# Patient Record
Sex: Female | Born: 1990 | Race: Black or African American | Hispanic: No | Marital: Single | Smoking: Never smoker
Health system: Southern US, Community
[De-identification: ages and names within clinical notes are randomized; demographics above are authoritative.]

## PROBLEM LIST (undated history)

## (undated) ENCOUNTER — Inpatient Hospital Stay (HOSPITAL_COMMUNITY): Payer: Self-pay

## (undated) DIAGNOSIS — F410 Panic disorder [episodic paroxysmal anxiety] without agoraphobia: Secondary | ICD-10-CM

## (undated) DIAGNOSIS — J45909 Unspecified asthma, uncomplicated: Secondary | ICD-10-CM

## (undated) DIAGNOSIS — Z9109 Other allergy status, other than to drugs and biological substances: Secondary | ICD-10-CM

## (undated) DIAGNOSIS — K8020 Calculus of gallbladder without cholecystitis without obstruction: Secondary | ICD-10-CM

## (undated) HISTORY — PX: NO PAST SURGERIES: SHX2092

---
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
## 2010-01-25 ENCOUNTER — Emergency Department (HOSPITAL_BASED_OUTPATIENT_CLINIC_OR_DEPARTMENT_OTHER)
Admission: EM | Admit: 2010-01-25 | Discharge: 2010-01-25 | Payer: Self-pay | Source: Home / Self Care | Admitting: Emergency Medicine

## 2010-04-20 LAB — URINE CULTURE: Colony Count: >=100000

## 2010-04-20 LAB — URINALYSIS, ROUTINE W REFLEX MICROSCOPIC
Bilirubin Urine: NEGATIVE
Nitrite: POSITIVE — AB
Specific Gravity, Urine: 1.026 (ref 1.005–1.030)
Urobilinogen, UA: 1 mg/dL (ref 0.0–1.0)
pH: 6.5 (ref 5.0–8.0)

## 2010-04-20 LAB — URINE MICROSCOPIC-ADD ON

## 2010-04-20 LAB — PREGNANCY, URINE: Preg Test, Ur: NEGATIVE

## 2010-05-12 LAB — CBC
HCT: 36.5 % (ref 36.0–46.0)
Hemoglobin: 10.4 g/dL — ABNORMAL LOW (ref 12.0–15.0)
Hemoglobin: 12.5 g/dL (ref 12.0–15.0)
Platelets: 212 10*3/uL (ref 150–400)
Platelets: 246 10*3/uL (ref 150–400)
RDW: 13.3 % (ref 11.5–15.5)
RDW: 13.4 % (ref 11.5–15.5)
WBC: 13.4 10*3/uL — ABNORMAL HIGH (ref 4.0–10.5)

## 2010-05-12 LAB — RPR: RPR Ser Ql: NONREACTIVE

## 2010-05-15 LAB — URINALYSIS, ROUTINE W REFLEX MICROSCOPIC
Ketones, ur: NEGATIVE mg/dL
Protein, ur: NEGATIVE mg/dL

## 2010-05-15 LAB — STREP B DNA PROBE: Strep Group B Ag: NEGATIVE

## 2010-05-15 LAB — WET PREP, GENITAL
Trich, Wet Prep: NONE SEEN
Yeast Wet Prep HPF POC: NONE SEEN

## 2010-05-15 LAB — RAPID URINE DRUG SCREEN, HOSP PERFORMED
Amphetamines: NOT DETECTED
Cocaine: NOT DETECTED
Opiates: NOT DETECTED

## 2010-05-16 LAB — URINALYSIS, ROUTINE W REFLEX MICROSCOPIC
Glucose, UA: NEGATIVE mg/dL
Hgb urine dipstick: NEGATIVE
Ketones, ur: NEGATIVE mg/dL
Nitrite: NEGATIVE
Protein, ur: NEGATIVE mg/dL
Specific Gravity, Urine: 1.014 (ref 1.005–1.030)
pH: 8 (ref 5.0–8.0)

## 2010-05-16 LAB — POCT I-STAT, CHEM 8
BUN: <3 mg/dL — ABNORMAL LOW (ref 6–23)
Glucose, Bld: 77 mg/dL (ref 70–99)
Potassium: 3.9 mEq/L (ref 3.5–5.1)
Sodium: 137 mEq/L (ref 135–145)
TCO2: 25 mmol/L (ref 0–100)

## 2010-05-16 LAB — GLUCOSE, CAPILLARY: Glucose-Capillary: 95 mg/dL (ref 70–99)

## 2010-05-16 LAB — ABO/RH: ABO/RH(D): O POS

## 2010-05-16 LAB — URINE CULTURE

## 2010-05-17 LAB — URINALYSIS, ROUTINE W REFLEX MICROSCOPIC
Bilirubin Urine: NEGATIVE
Glucose, UA: NEGATIVE mg/dL
Ketones, ur: NEGATIVE mg/dL
Nitrite: NEGATIVE
Urobilinogen, UA: 1 mg/dL (ref 0.0–1.0)

## 2010-05-17 LAB — BASIC METABOLIC PANEL
BUN: 9 mg/dL (ref 6–23)
Calcium: 9 mg/dL (ref 8.4–10.5)
Chloride: 105 mEq/L (ref 96–112)
Creatinine, Ser: 0.6 mg/dL (ref 0.4–1.2)
GFR calc Af Amer: >60 mL/min (ref >60)
GFR calc non Af Amer: >60 mL/min (ref >60)
Potassium: 3.8 mEq/L (ref 3.5–5.1)

## 2010-05-17 LAB — CBC
RBC: 4.07 MIL/uL (ref 3.87–5.11)
RDW: 12.6 % (ref 11.5–15.5)

## 2010-05-17 LAB — WET PREP, GENITAL
Clue Cells Wet Prep HPF POC: NONE SEEN
Trich, Wet Prep: NONE SEEN
Yeast Wet Prep HPF POC: NONE SEEN

## 2010-05-17 LAB — ABO/RH: ABO/RH(D): O POS

## 2010-05-17 LAB — DIFFERENTIAL
Basophils Absolute: 0 10*3/uL (ref 0.0–0.1)
Basophils Relative: 0 % (ref 0–1)
Eosinophils Relative: 2 % (ref 0–5)
Neutro Abs: 6.4 10*3/uL (ref 1.7–7.7)
Neutrophils Relative %: 69 % (ref 43–77)

## 2011-02-09 NOTE — L&D Delivery Note (Signed)
Delivery Note Pt rapidly progressed to complete dilation while waiting for epidural.  She then pushed about 5 min and at 2:25 PM a healthy female was delivered via  (Presentation:LOA).  APGAR:8,9 weight pending .   Placenta status: , .  Cord:  with the following complications: none.   Anesthesia:none Episiotomy: n/a Lacerations: none Suture Repair: n/a Est. Blood Loss (mL): 300cc  Mom to postpartum.  Baby to nursery-stable.  Oliver Pila 08/09/2011, 2:38 PM

## 2011-04-15 ENCOUNTER — Encounter (HOSPITAL_BASED_OUTPATIENT_CLINIC_OR_DEPARTMENT_OTHER): Payer: Self-pay | Admitting: *Deleted

## 2011-04-15 ENCOUNTER — Emergency Department (HOSPITAL_BASED_OUTPATIENT_CLINIC_OR_DEPARTMENT_OTHER)
Admission: EM | Admit: 2011-04-15 | Discharge: 2011-04-15 | Disposition: A | Discharge status: Home / Self Care | Payer: Medicaid Other | Attending: Emergency Medicine | Admitting: Emergency Medicine

## 2011-04-15 DIAGNOSIS — O210 Mild hyperemesis gravidarum: Secondary | ICD-10-CM | POA: Insufficient documentation

## 2011-04-15 DIAGNOSIS — O9989 Other specified diseases and conditions complicating pregnancy, childbirth and the puerperium: Secondary | ICD-10-CM | POA: Insufficient documentation

## 2011-04-15 DIAGNOSIS — R51 Headache: Secondary | ICD-10-CM

## 2011-04-15 MED ORDER — DIPHENHYDRAMINE HCL 50 MG/ML IJ SOLN
25.0000 mg | Freq: Once | INTRAMUSCULAR | Status: AC
  Administered 2011-04-15: 25 mg via INTRAVENOUS
  Filled 2011-04-15: qty 1

## 2011-04-15 MED ORDER — METOCLOPRAMIDE HCL 5 MG/ML IJ SOLN
10.0000 mg | Freq: Once | INTRAMUSCULAR | Status: AC
  Administered 2011-04-15: 10 mg via INTRAMUSCULAR
  Filled 2011-04-15: qty 2
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
## 2011-04-15 NOTE — ED Notes (Signed)
C/o headaches with nausea and vomiting since earlier today. Pt is [redacted] weeks pregnant.

## 2011-04-15 NOTE — Discharge Instructions (Signed)

## 2011-04-18 ENCOUNTER — Encounter (HOSPITAL_BASED_OUTPATIENT_CLINIC_OR_DEPARTMENT_OTHER): Payer: Self-pay

## 2011-04-18 ENCOUNTER — Emergency Department (HOSPITAL_BASED_OUTPATIENT_CLINIC_OR_DEPARTMENT_OTHER)
Admission: EM | Admit: 2011-04-18 | Discharge: 2011-04-19 | Disposition: A | Discharge status: Home / Self Care | Payer: Medicaid Other | Attending: Emergency Medicine | Admitting: Emergency Medicine

## 2011-04-18 DIAGNOSIS — O219 Vomiting of pregnancy, unspecified: Secondary | ICD-10-CM

## 2011-04-18 DIAGNOSIS — R51 Headache: Secondary | ICD-10-CM | POA: Insufficient documentation

## 2011-04-18 DIAGNOSIS — O9989 Other specified diseases and conditions complicating pregnancy, childbirth and the puerperium: Secondary | ICD-10-CM | POA: Insufficient documentation

## 2011-04-18 LAB — URINALYSIS, ROUTINE W REFLEX MICROSCOPIC
Bilirubin Urine: NEGATIVE
Glucose, UA: NEGATIVE mg/dL
Hgb urine dipstick: NEGATIVE
Ketones, ur: >80 mg/dL — AB
Protein, ur: NEGATIVE mg/dL

## 2011-04-18 LAB — URINE MICROSCOPIC-ADD ON

## 2011-04-18 MED ORDER — DIPHENHYDRAMINE HCL 50 MG/ML IJ SOLN
12.5000 mg | Freq: Once | INTRAMUSCULAR | Status: AC
  Administered 2011-04-18: 12.5 mg via INTRAVENOUS
  Filled 2011-04-18: qty 1

## 2011-04-18 MED ORDER — SODIUM CHLORIDE 0.9 % IV SOLN
Freq: Once | INTRAVENOUS | Status: AC
  Administered 2011-04-18: 1000 mL via INTRAVENOUS

## 2011-04-18 MED ORDER — METOCLOPRAMIDE HCL 5 MG/ML IJ SOLN
10.0000 mg | Freq: Once | INTRAMUSCULAR | Status: AC
  Administered 2011-04-18: 10 mg via INTRAVENOUS
  Filled 2011-04-18: qty 2

## 2011-04-18 NOTE — ED Provider Notes (Signed)
Medical screening examination/treatment/procedure(s) were performed by non-physician practitioner and as supervising physician I was immediately available for consultation/collaboration.   Mykah Bellomo E Tyee Vandevoorde, MD 04/18/11 0734 

## 2011-04-18 NOTE — ED Provider Notes (Signed)
History     CSN: [id number]  Arrival date & time 04/18/11  2157   First MD Initiated Contact with Patient 04/18/11 2303      Chief Complaint  Patient presents with  . Headache  . Emesis During Pregnancy    (Consider location/radiation/quality/duration/timing/severity/associated sxs/prior treatment) Patient is a 21 y.o. female presenting with headaches and vomiting. The history is provided by the patient. No language interpreter was used.  Headache  This is a recurrent problem. The current episode started 12 to 24 hours ago. The problem occurs constantly. The problem has been gradually worsening. The headache is associated with an unknown factor. The pain is located in the frontal region. The quality of the pain is described as sharp. The pain is at a severity of 6/10. The pain does not radiate. Associated symptoms include nausea and vomiting. She has tried nothing for the symptoms. The treatment provided moderate relief.  Emesis  This is a new problem. The current episode started 2 days ago. The problem has not changed since onset.The emesis has an appearance of stomach contents. There has been no fever. Associated symptoms include headaches.  Pt is pregnant.  Pt reports mul tuple episodes of vomiting.  Pt reports a headache from vomiting.  Pt is pregnant.  She is schedule to recheck with her OB on Tuesday.  Pt was here 5 days ago for the same.  Pt felt better after reglan injection  No past medical history on file.  No past surgical history on file.  No family history on file.  History  Substance Use Topics  . Smoking status: Never Smoker   . Smokeless tobacco: Not on file  . Alcohol Use: No    OB History    Grav Para Term Preterm Abortions TAB SAB Ect Mult Living   2 1 1       1       Review of Systems  Gastrointestinal: Positive for nausea and vomiting.  Neurological: Positive for headaches.  All other systems reviewed and are negative.    Allergies  Review of  patient's allergies indicates no known allergies.  Home Medications   Current Outpatient Rx  Name Route Sig Dispense Refill  . ACETAMINOPHEN 325 MG PO TABS Oral Take 325 mg by mouth once as needed. For pain    . PRE-NATAL PO Oral Take 1 tablet by mouth daily.      BP 120/58  Pulse 117  Temp(Src) 98.6 F (37 C) (Oral)  Resp 20  Ht 5\' 4"  (1.626 m)  Wt 141 lb (63.957 kg)  BMI 24.20 kg/m2  LMP 11/19/2010  Physical Exam  Vitals reviewed. Constitutional: She is oriented to person, place, and time. She appears well-developed and well-nourished.  HENT:  Head: Normocephalic and atraumatic.  Right Ear: External ear normal.  Left Ear: External ear normal.  Nose: Nose normal.  Mouth/Throat: Oropharynx is clear and moist.  Eyes: Pupils are equal, round, and reactive to light.  Neck: Normal range of motion.  Cardiovascular: Normal rate and normal heart sounds.   Pulmonary/Chest: Effort normal.  Abdominal: Soft.  Musculoskeletal: Normal range of motion.  Neurological: She is alert and oriented to person, place, and time.  Skin: Skin is warm.  Psychiatric: She has a normal mood and affect.    ED Course  Procedures (including critical care time)  Labs Reviewed  URINALYSIS, ROUTINE W REFLEX MICROSCOPIC - Abnormal; Notable for the following:    Ketones, ur >80 (*)    Leukocytes, UA SMALL (*)  All other components within normal limits  URINE MICROSCOPIC-ADD ON - Abnormal; Notable for the following:    Squamous Epithelial / LPF FEW (*)    Bacteria, UA FEW (*)    All other components within normal limits   No results found.   No diagnosis found.    MDM  Pt given IV ns x 2 liters,  Benadryl and reglan.   [facility] advised [facility] any futher vomiting.  Pt is advised to see Dr. Ellyn Hack as scheduled        Lonia Skinner [place], [place] 04/19/11 0000

## 2011-04-18 NOTE — ED Notes (Signed)
Pt is pregnant, c/o headache with severe nausea/vomiting.

## 2011-04-19 MED ORDER — PROMETHAZINE HCL 25 MG RE SUPP: 25.0000 mg | Freq: Four times a day (QID) | RECTAL | Status: DC | PRN

## 2011-04-19 NOTE — Discharge Instructions (Signed)
Nausea and Vomiting   Nausea means you feel sick to your stomach. Throwing up (vomiting) is a reflex where stomach contents come out of your mouth.   HOME CARE   Take medicine as told by your doctor.   Do not force yourself to eat. However, you do need to drink fluids.   If you feel like eating, eat a normal diet as told by your doctor.   Eat rice, wheat, potatoes, bread, lean meats, yogurt, fruits, and vegetables.   Avoid high-fat foods.   Drink enough fluids to keep your pee (urine) clear or pale yellow.   Ask your doctor how to replace body fluid losses (rehydrate). Signs of body fluid loss (dehydration) include:   Feeling very thirsty.   Dry lips and mouth.   Feeling dizzy.   Dark pee.   Peeing less than normal.   Feeling confused.   Fast breathing or heart rate.   GET HELP RIGHT AWAY IF:   You have blood in your throw up.   You have black or bloody poop (stool).   You have a bad headache or stiff neck.   You feel confused.   You have bad belly (abdominal) pain.   You have chest pain or trouble breathing.   You do not pee at least once every 8 hours.   You have cold, clammy skin.   You keep throwing up after 24 to 48 hours.   You have a fever.   MAKE SURE YOU:   Understand these instructions.   Will watch your condition.   Will get help right away if you are not doing well or get worse.   Document Released: 07/14/2007 Document Revised: 01/14/2011 Document Reviewed: 06/26/2010   [employer] Patient Information 2012 [employer].

## 2011-04-19 NOTE — ED Provider Notes (Signed)
Medical screening examination/treatment/procedure(s) were performed by non-physician practitioner and as supervising physician I was immediately available for consultation/collaboration.   Hanley Seamen, MD 04/19/11 [phone]

## 2011-04-28 LAB — OB RESULTS CONSOLE RPR: RPR: NONREACTIVE

## 2011-04-28 LAB — OB RESULTS CONSOLE HIV ANTIBODY (ROUTINE TESTING): HIV: NONREACTIVE

## 2011-07-21 LAB — OB RESULTS CONSOLE GBS: GBS: NEGATIVE

## 2011-08-09 ENCOUNTER — Encounter (HOSPITAL_COMMUNITY): Payer: Self-pay | Admitting: *Deleted

## 2011-08-09 ENCOUNTER — Inpatient Hospital Stay (HOSPITAL_COMMUNITY)
Admission: AD | Admit: 2011-08-09 | Discharge: 2011-08-11 | DRG: 775 | Disposition: A | Discharge status: Home / Self Care | Payer: Medicaid Other | Attending: Obstetrics and Gynecology | Admitting: Obstetrics and Gynecology

## 2011-08-09 HISTORY — DX: Unspecified asthma, uncomplicated: J45.909

## 2011-08-09 LAB — CBC
Hemoglobin: 11.6 g/dL — ABNORMAL LOW (ref 12.0–15.0)
MCH: 29.7 pg (ref 26.0–34.0)
RBC: 3.91 MIL/uL (ref 3.87–5.11)
WBC: 9.8 10*3/uL (ref 4.0–10.5)

## 2011-08-09 MED ORDER — LACTATED RINGERS IV SOLN: 500.0000 mL | Freq: Once | INTRAVENOUS | Status: DC

## 2011-08-09 MED ORDER — OXYTOCIN 40 UNITS IN LACTATED RINGERS INFUSION - SIMPLE MED: 62.5000 mL/h | Freq: Once | INTRAVENOUS | Status: DC

## 2011-08-09 MED ORDER — WITCH HAZEL-GLYCERIN EX PADS: 1.0000 "application " | MEDICATED_PAD | CUTANEOUS | Status: DC | PRN

## 2011-08-09 MED ORDER — LIDOCAINE HCL (PF) 1 % IJ SOLN: 30.0000 mL | INTRAMUSCULAR | Status: DC | PRN

## 2011-08-09 MED ORDER — DIPHENHYDRAMINE HCL 25 MG PO CAPS: 25.0000 mg | ORAL_CAPSULE | Freq: Four times a day (QID) | ORAL | Status: DC | PRN

## 2011-08-09 MED ORDER — PHENYLEPHRINE 40 MCG/ML (10ML) SYRINGE FOR IV PUSH (FOR BLOOD PRESSURE SUPPORT)
80.0000 ug | PREFILLED_SYRINGE | INTRAVENOUS | Status: DC | PRN
  Filled 2011-08-09: qty 5

## 2011-08-09 MED ORDER — ONDANSETRON HCL 4 MG PO TABS: 4.0000 mg | ORAL_TABLET | ORAL | Status: DC | PRN

## 2011-08-09 MED ORDER — CITRIC ACID-SODIUM CITRATE 334-500 MG/5ML PO SOLN: 30.0000 mL | ORAL | Status: DC | PRN

## 2011-08-09 MED ORDER — OXYTOCIN 40 UNITS IN LACTATED RINGERS INFUSION - SIMPLE MED
1.0000 m[IU]/min | INTRAVENOUS | Status: DC
  Administered 2011-08-09: 2 m[IU]/min via INTRAVENOUS
  Filled 2011-08-09: qty 1000

## 2011-08-09 MED ORDER — ACETAMINOPHEN 325 MG PO TABS: 650.0000 mg | ORAL_TABLET | ORAL | Status: DC | PRN

## 2011-08-09 MED ORDER — FENTANYL 2.5 MCG/ML BUPIVACAINE 1/10 % EPIDURAL INFUSION (WH - ANES)
14.0000 mL/h | INTRAMUSCULAR | Status: DC
  Filled 2011-08-09: qty 60

## 2011-08-09 MED ORDER — SENNOSIDES-DOCUSATE SODIUM 8.6-50 MG PO TABS
2.0000 | ORAL_TABLET | Freq: Every day | ORAL | Status: DC
  Administered 2011-08-09 – 2011-08-10 (×2): 2 via ORAL

## 2011-08-09 MED ORDER — OXYCODONE-ACETAMINOPHEN 5-325 MG PO TABS
1.0000 | ORAL_TABLET | ORAL | Status: DC | PRN
  Administered 2011-08-09 – 2011-08-10 (×5): 1 via ORAL
  Administered 2011-08-10: 2 via ORAL
  Filled 2011-08-09: qty 1
  Filled 2011-08-09: qty 2
  Filled 2011-08-09 (×4): qty 1

## 2011-08-09 MED ORDER — BENZOCAINE-MENTHOL 20-0.5 % EX AERO: 1.0000 "application " | INHALATION_SPRAY | CUTANEOUS | Status: DC | PRN

## 2011-08-09 MED ORDER — DIBUCAINE 1 % RE OINT: 1.0000 "application " | TOPICAL_OINTMENT | RECTAL | Status: DC | PRN

## 2011-08-09 MED ORDER — ZOLPIDEM TARTRATE 5 MG PO TABS: 5.0000 mg | ORAL_TABLET | Freq: Every evening | ORAL | Status: DC | PRN

## 2011-08-09 MED ORDER — ONDANSETRON HCL 4 MG/2ML IJ SOLN: 4.0000 mg | Freq: Four times a day (QID) | INTRAMUSCULAR | Status: DC | PRN

## 2011-08-09 MED ORDER — PHENYLEPHRINE 40 MCG/ML (10ML) SYRINGE FOR IV PUSH (FOR BLOOD PRESSURE SUPPORT): 80.0000 ug | PREFILLED_SYRINGE | INTRAVENOUS | Status: DC | PRN

## 2011-08-09 MED ORDER — SIMETHICONE 80 MG PO CHEW: 80.0000 mg | CHEWABLE_TABLET | ORAL | Status: DC | PRN

## 2011-08-09 MED ORDER — EPHEDRINE 5 MG/ML INJ
10.0000 mg | INTRAVENOUS | Status: DC | PRN
  Filled 2011-08-09: qty 4

## 2011-08-09 MED ORDER — LANOLIN HYDROUS EX OINT: TOPICAL_OINTMENT | CUTANEOUS | Status: DC | PRN

## 2011-08-09 MED ORDER — OXYCODONE-ACETAMINOPHEN 5-325 MG PO TABS: 1.0000 | ORAL_TABLET | ORAL | Status: DC | PRN

## 2011-08-09 MED ORDER — IBUPROFEN 600 MG PO TABS
600.0000 mg | ORAL_TABLET | Freq: Four times a day (QID) | ORAL | Status: DC
  Administered 2011-08-10 – 2011-08-11 (×6): 600 mg via ORAL
  Filled 2011-08-09 (×6): qty 1

## 2011-08-09 MED ORDER — ONDANSETRON HCL 4 MG/2ML IJ SOLN: 4.0000 mg | INTRAMUSCULAR | Status: DC | PRN

## 2011-08-09 MED ORDER — LACTATED RINGERS IV SOLN: 500.0000 mL | INTRAVENOUS | Status: DC | PRN

## 2011-08-09 MED ORDER — OXYTOCIN BOLUS FROM INFUSION
250.0000 mL | Freq: Once | INTRAVENOUS | Status: DC
  Filled 2011-08-09: qty 500

## 2011-08-09 MED ORDER — EPHEDRINE 5 MG/ML INJ: 10.0000 mg | INTRAVENOUS | Status: DC | PRN

## 2011-08-09 MED ORDER — TETANUS-DIPHTH-ACELL PERTUSSIS 5-2.5-18.5 LF-MCG/0.5 IM SUSP
0.5000 mL | Freq: Once | INTRAMUSCULAR | Status: AC
  Administered 2011-08-10: 0.5 mL via INTRAMUSCULAR
  Filled 2011-08-09: qty 0.5

## 2011-08-09 MED ORDER — FLEET ENEMA 7-19 GM/118ML RE ENEM: 1.0000 | ENEMA | RECTAL | Status: DC | PRN

## 2011-08-09 MED ORDER — TERBUTALINE SULFATE 1 MG/ML IJ SOLN: 0.2500 mg | Freq: Once | INTRAMUSCULAR | Status: DC | PRN

## 2011-08-09 MED ORDER — LACTATED RINGERS IV SOLN
INTRAVENOUS | Status: DC
  Administered 2011-08-09: 10:00:00 via INTRAVENOUS

## 2011-08-09 MED ORDER — BUTORPHANOL TARTRATE 2 MG/ML IJ SOLN: 1.0000 mg | INTRAMUSCULAR | Status: DC | PRN

## 2011-08-09 MED ORDER — IBUPROFEN 600 MG PO TABS
600.0000 mg | ORAL_TABLET | Freq: Four times a day (QID) | ORAL | Status: DC | PRN
  Administered 2011-08-09: 600 mg via ORAL
  Filled 2011-08-09: qty 1

## 2011-08-09 MED ORDER — DIPHENHYDRAMINE HCL 50 MG/ML IJ SOLN: 12.5000 mg | INTRAMUSCULAR | Status: DC | PRN

## 2011-08-09 MED ORDER — PRENATAL MULTIVITAMIN CH
1.0000 | ORAL_TABLET | Freq: Every day | ORAL | Status: DC
  Administered 2011-08-10 – 2011-08-11 (×2): 1 via ORAL
  Filled 2011-08-09 (×2): qty 1

## 2011-08-09 NOTE — MAU Note (Signed)
Patient states she had leaking of fluid but is not having actively leaking at this time. Is not wearing a pad.

## 2011-08-09 NOTE — H&P (Signed)
Yvonne Gentry is a 21 y.o. female G2P1001 at 19 5/7 (EDD 08/18/11 by [place] at 20 weeks) presenting for contractions and possible ROM. Fern and Nit neg but has BBOW and cervix now c/5/-1.  Ctx are still irregular but strong when occur.  Prenatal care complicated only by late care at 24 weeks.  No other significant issues.  Maternal Medical History:  Reason for admission: Reason for admission: contractions.  Contractions: Onset was 3-5 hours ago.   Frequency: irregular.   Perceived severity is moderate.    Fetal activity: Perceived fetal activity is normal.      OB History    Grav Para Term Preterm Abortions TAB SAB Ect Mult Living   2 1 1       1     2010  NSVD 7#0oz  Past Medical History  Diagnosis Date  . Asthma    Past Surgical History  Procedure Date  . No past surgeries    Family History: family history includes Diabetes in her brother and Hypertension in her father. Social History:  reports that she has never smoked. She does not have any smokeless tobacco history on file. She reports that she does not drink alcohol or use illicit drugs.   Prenatal Transfer Tool  Maternal Diabetes: No Genetic Screening: too late to care Maternal Ultrasounds/Referrals: Normal Fetal Ultrasounds or other Referrals:  None Maternal Substance Abuse:  No Significant Maternal Medications:  None Significant Maternal Lab Results:  None Other Comments:  None  ROS Cervix c/5/-1 BBOW  Blood pressure 117/73, pulse 80, temperature 97.8 F (36.6 C), temperature source Oral, resp. rate 18, height 5\' 4"  (1.626 m), weight 74.662 kg (164 lb 9.6 oz), last menstrual period 11/19/2010. Exam Physical Exam  Constitutional: She is oriented to person, place, and time. She appears well-developed and well-nourished.  Cardiovascular: Normal rate and regular rhythm.   Respiratory: Effort normal and breath sounds normal.  GI: Soft.  Genitourinary: Vagina normal and uterus normal.  Neurological: She is alert  and oriented to person, place, and time.  Psychiatric: She has a normal mood and affect. Her behavior is normal.    Prenatal labs: ABO, Rh:  O positive Antibody: Negative (03/20 0000) Rubella: Immune (03/20 0000) RPR: Nonreactive (03/20 0000)  HBsAg: Negative (03/20 0000)  HIV: Non-reactive (03/20 0000)  GBS: Negative (06/12 0000)  One hour GTT 82 Hgb AA Too late for genetic screens Assessment/Plan: Pt admitted for latent phase labor with cervical change to 5cm.  Pt is not sure if will get epidural.  FHR reassuring but not reactive, no decels and good variability.  Oliver Pila 08/09/2011, 9:18 AM

## 2011-08-09 NOTE — Progress Notes (Signed)
  Subjective: Pt increasingly uncomfortable  Objective: BP 118/69  Pulse 77  Temp 97.8 F (36.6 C) (Oral)  Resp 20  Ht 5\' 4"  (1.626 m)  Wt 74.662 kg (164 lb 9.6 oz)  BMI 28.25 kg/m2  LMP 11/19/2010      FHT:  FHR: 135 bpm, variability: moderate,  accelerations:  Present,  decelerations:  Absent UC:   regular, every 3-4 minutes SVE:   Dilation: 5.5 Effacement (%): 100 Station: -1 Exam by:: Dr Senaida Ores AROM clear  Labs: Lab Results  Component Value Date   WBC 9.8 08/09/2011   HGB 11.6* 08/09/2011   HCT 34.1* 08/09/2011   MCV 87.2 08/09/2011   PLT 192 08/09/2011    Assessment / Plan: Pt still not in active phase, but hopefully getting there.  Will follow progress.  FHR reassuring overall.  Yvonne Gentry 08/09/2011, 1:18 PM

## 2011-08-09 NOTE — MAU Note (Signed)
Pt reported that she felt leaking fluids around 0630 while resting in bed.  Underwear was wet upon getting up and she felt more clear fluids leaking into toilet.  Feeling irregular contractions today.  Denies bleeding.  Feels decreased fetal movement.

## 2011-08-10 LAB — CBC
MCV: 87 fL (ref 78.0–100.0)
Platelets: 206 10*3/uL (ref 150–400)
RBC: 3.84 MIL/uL — ABNORMAL LOW (ref 3.87–5.11)
RDW: 12.9 % (ref 11.5–15.5)
WBC: 12.1 10*3/uL — ABNORMAL HIGH (ref 4.0–10.5)

## 2011-08-10 LAB — RPR: RPR Ser Ql: NONREACTIVE

## 2011-08-10 NOTE — Progress Notes (Signed)
Post Partum Day 1 Subjective: no complaints, voiding and tolerating PO  Objective: Blood pressure 113/67, pulse 73, temperature 97.7 F (36.5 C), temperature source Oral, resp. rate 18, height 5\' 4"  (1.626 m), weight 74.662 kg (164 lb 9.6 oz), last menstrual period 11/19/2010, unknown if currently breastfeeding.  Physical Exam:  General: alert and cooperative Lochia: appropriate Uterine Fundus: firm    Basename 08/10/11 0520 08/09/11 0940  HGB 11.2* 11.6*  HCT 33.4* 34.1*    Assessment/Plan: Plan for discharge tomorrow   LOS: 1 day   Yvonne Gentry W 08/10/2011, 9:35 AM

## 2011-08-10 NOTE — Progress Notes (Signed)
UR chart review completed.  

## 2011-08-11 MED ORDER — OXYCODONE-ACETAMINOPHEN 5-325 MG PO TABS: 1.0000 | ORAL_TABLET | ORAL | Status: AC | PRN

## 2011-08-11 MED ORDER — IBUPROFEN 600 MG PO TABS: 600.0000 mg | ORAL_TABLET | Freq: Four times a day (QID) | ORAL | Status: AC

## 2011-08-11 NOTE — Discharge Summary (Signed)
Obstetric Discharge Summary Reason for Admission: onset of labor Prenatal Procedures: none Intrapartum Procedures: spontaneous vaginal delivery Postpartum Procedures: none Complications-Operative and Postpartum: none Hemoglobin  Date Value Range Status  08/10/2011 11.2* 12.0 - 15.0 g/dL Final     HCT  Date Value Range Status  08/10/2011 33.4* 36.0 - 46.0 % Final    Physical Exam:  General: alert and cooperative Lochia: appropriate Uterine Fundus: firm   Discharge Diagnoses: Term Pregnancy-delivered  Discharge Information: Date: 08/11/2011 Activity: pelvic rest Diet: routine Medications: Ibuprofen and Percocet Condition: improved Instructions: refer to practice specific booklet Discharge to: home   Newborn Data: Live born female  Birth Weight: 7 lb 7.9 oz (3400 g) APGAR: 8, 9  Home with mother.  Yvonne Gentry 08/11/2011, 8:27 AM

## 2011-08-11 NOTE — Progress Notes (Signed)
Post Partum Day 2 Subjective: no complaints, up ad lib and tolerating PO  Objective: Blood pressure 113/72, pulse 69, temperature 98.5 F (36.9 C), temperature source Oral, resp. rate 18, height 5\' 4"  (1.626 m), weight 74.662 kg (164 lb 9.6 oz), last menstrual period 11/19/2010, unknown if currently breastfeeding.  Physical Exam:  General: alert and cooperative Lochia: appropriate Uterine Fundus: firm     Basename 08/10/11 0520 08/09/11 0940  HGB 11.2* 11.6*  HCT 33.4* 34.1*    Assessment/Plan: Discharge home Motrin and percocet Plans circumcision in office and advised to call this week to set up   LOS: 2 days   Yvonne Gentry 08/11/2011, 8:24 AM

## 2011-12-19 ENCOUNTER — Encounter (HOSPITAL_BASED_OUTPATIENT_CLINIC_OR_DEPARTMENT_OTHER): Payer: Self-pay

## 2011-12-19 ENCOUNTER — Emergency Department (HOSPITAL_BASED_OUTPATIENT_CLINIC_OR_DEPARTMENT_OTHER)
Admission: EM | Admit: 2011-12-19 | Discharge: 2011-12-19 | Disposition: A | Discharge status: Home / Self Care | Payer: Self-pay | Attending: Emergency Medicine | Admitting: Emergency Medicine

## 2011-12-19 DIAGNOSIS — H109 Unspecified conjunctivitis: Secondary | ICD-10-CM | POA: Insufficient documentation

## 2011-12-19 DIAGNOSIS — J45909 Unspecified asthma, uncomplicated: Secondary | ICD-10-CM | POA: Insufficient documentation

## 2011-12-19 DIAGNOSIS — H5789 Other specified disorders of eye and adnexa: Secondary | ICD-10-CM | POA: Insufficient documentation

## 2011-12-19 HISTORY — DX: Other allergy status, other than to drugs and biological substances: Z91.09

## 2011-12-19 MED ORDER — FLUORESCEIN SODIUM 1 MG OP STRP
ORAL_STRIP | OPHTHALMIC | Status: AC
  Administered 2011-12-19: 03:00:00
  Filled 2011-12-19: qty 1

## 2011-12-19 MED ORDER — ERYTHROMYCIN 5 MG/GM OP OINT: TOPICAL_OINTMENT | OPHTHALMIC | Status: DC

## 2011-12-19 MED ORDER — TETRACAINE HCL 0.5 % OP SOLN
OPHTHALMIC | Status: AC
  Administered 2011-12-19: 03:00:00
  Filled 2011-12-19: qty 2

## 2011-12-19 NOTE — ED Notes (Signed)
Patient complains of left eye pain with itching, redness and drainage. Denies trauma to same, no distress

## 2011-12-19 NOTE — ED Provider Notes (Signed)
History     CSN: [id number]  Arrival date & time 12/19/11  0151   First MD Initiated Contact with Patient 12/19/11 0208      Chief Complaint  Patient presents with  . Eye Pain    (Consider location/radiation/quality/duration/timing/severity/associated sxs/prior treatment) Patient is a 21 y.o. female presenting with eye pain and conjunctivitis. The history is provided by the patient.  Eye Pain This is a new problem. The current episode started 2 days ago. The problem occurs constantly. The problem has not changed since onset.Nothing aggravates the symptoms. Nothing relieves the symptoms. She has tried nothing for the symptoms. The treatment provided no relief.  Conjunctivitis  The current episode started yesterday. The onset was gradual. The problem occurs continuously. The problem has been unchanged. The problem is mild. Nothing relieves the symptoms. Nothing aggravates the symptoms. Associated symptoms include eye pain and eye redness. Pertinent negatives include no fever, no decreased vision, no double vision, no photophobia, no neck stiffness, no URI and no eye discharge. The eye pain is mild. The left eye is affected.The eye pain is not associated with movement. The eyelid exhibits no abnormality.    Past Medical History  Diagnosis Date  . Asthma   . Pollen allergies     Past Surgical History  Procedure Date  . No past surgeries     Family History  Problem Relation Age of Onset  . Hypertension Father   . Diabetes Brother     History  Substance Use Topics  . Smoking status: Never Smoker   . Smokeless tobacco: Never Used  . Alcohol Use: No    OB History    Grav Para Term Preterm Abortions TAB SAB Ect Mult Living   2 2 2       2       Review of Systems  Constitutional: Negative for fever.  Eyes: Positive for pain and redness. Negative for double vision, photophobia and discharge.  All other systems reviewed and are negative.    Allergies  Review of  patient's allergies indicates no known allergies.  Home Medications  No current outpatient prescriptions on file.  Pulse 116  Temp 97.3 F (36.3 C) (Oral)  Resp 20  SpO2 99%  LMP 12/05/2011  Breastfeeding? Yes  Physical Exam  Constitutional: She is oriented to person, place, and time. She appears well-developed and well-nourished. No distress.  HENT:  Head: Normocephalic.  Mouth/Throat: Oropharynx is clear and moist.  Eyes: EOM are normal. Pupils are equal, round, and reactive to light.       Left eye slight injection  Neck: Normal range of motion.  Cardiovascular: Normal rate and regular rhythm.   Pulmonary/Chest: Effort normal and breath sounds normal. She has no wheezes. She has no rales.  Abdominal: Soft. Bowel sounds are normal. There is no tenderness. There is no rebound and no guarding.  Musculoskeletal: Normal range of motion.  Neurological: She is alert and oriented to person, place, and time.  Skin: Skin is warm and dry.  Psychiatric: She has a normal mood and affect.    ED Course  Procedures (including critical care time)  Labs Reviewed - No data to display No results found.   No diagnosis found.    MDM  Will prescribe eye ointment good hand hygiene in the home.  Follow up with your eye doctor for continued symptoms        Darica Goren K Jaquarius Seder-Rasch, MD 12/19/11 [phone]

## 2012-03-12 ENCOUNTER — Encounter (HOSPITAL_BASED_OUTPATIENT_CLINIC_OR_DEPARTMENT_OTHER): Payer: Self-pay | Admitting: Emergency Medicine

## 2012-03-12 ENCOUNTER — Emergency Department (HOSPITAL_BASED_OUTPATIENT_CLINIC_OR_DEPARTMENT_OTHER)
Admission: EM | Admit: 2012-03-12 | Discharge: 2012-03-12 | Disposition: A | Discharge status: Home / Self Care | Payer: Self-pay | Attending: Emergency Medicine | Admitting: Emergency Medicine

## 2012-03-12 DIAGNOSIS — M545 Low back pain, unspecified: Secondary | ICD-10-CM | POA: Insufficient documentation

## 2012-03-12 DIAGNOSIS — J45909 Unspecified asthma, uncomplicated: Secondary | ICD-10-CM | POA: Insufficient documentation

## 2012-03-12 DIAGNOSIS — Z79899 Other long term (current) drug therapy: Secondary | ICD-10-CM | POA: Insufficient documentation

## 2012-03-12 DIAGNOSIS — R51 Headache: Secondary | ICD-10-CM | POA: Insufficient documentation

## 2012-03-12 DIAGNOSIS — R6883 Chills (without fever): Secondary | ICD-10-CM | POA: Insufficient documentation

## 2012-03-12 DIAGNOSIS — N12 Tubulo-interstitial nephritis, not specified as acute or chronic: Secondary | ICD-10-CM | POA: Insufficient documentation

## 2012-03-12 DIAGNOSIS — R42 Dizziness and giddiness: Secondary | ICD-10-CM | POA: Insufficient documentation

## 2012-03-12 DIAGNOSIS — Z3202 Encounter for pregnancy test, result negative: Secondary | ICD-10-CM | POA: Insufficient documentation

## 2012-03-12 DIAGNOSIS — R110 Nausea: Secondary | ICD-10-CM | POA: Insufficient documentation

## 2012-03-12 LAB — CBC WITH DIFFERENTIAL/PLATELET
Basophils Absolute: 0 10*3/uL (ref 0.0–0.1)
Eosinophils Absolute: 0.1 10*3/uL (ref 0.0–0.7)
Eosinophils Relative: 1 % (ref 0–5)
MCH: 28.5 pg (ref 26.0–34.0)
MCV: 82.4 fL (ref 78.0–100.0)
Neutrophils Relative %: 73 % (ref 43–77)
Platelets: 262 10*3/uL (ref 150–400)
RDW: 12.6 % (ref 11.5–15.5)
WBC: 10.8 10*3/uL — ABNORMAL HIGH (ref 4.0–10.5)

## 2012-03-12 LAB — COMPREHENSIVE METABOLIC PANEL
ALT: 8 U/L (ref 0–35)
AST: 14 U/L (ref 0–37)
Albumin: 3.7 g/dL (ref 3.5–5.2)
Calcium: 9.5 mg/dL (ref 8.4–10.5)
GFR calc Af Amer: >90 mL/min (ref >90)
GFR calc non Af Amer: >90 mL/min (ref >90)
Potassium: 3.4 mEq/L — ABNORMAL LOW (ref 3.5–5.1)
Sodium: 139 mEq/L (ref 135–145)
Total Bilirubin: 0.6 mg/dL (ref 0.3–1.2)
Total Protein: 7.5 g/dL (ref 6.0–8.3)

## 2012-03-12 LAB — URINALYSIS, ROUTINE W REFLEX MICROSCOPIC
Bilirubin Urine: NEGATIVE
Ketones, ur: 15 mg/dL — AB
Nitrite: POSITIVE — AB
Specific Gravity, Urine: 1.015 (ref 1.005–1.030)
Urobilinogen, UA: 0.2 mg/dL (ref 0.0–1.0)

## 2012-03-12 LAB — URINE MICROSCOPIC-ADD ON

## 2012-03-12 LAB — LIPASE, BLOOD: Lipase: 21 U/L (ref 11–59)

## 2012-03-12 MED ORDER — ONDANSETRON HCL 4 MG/2ML IJ SOLN
4.0000 mg | Freq: Once | INTRAMUSCULAR | Status: AC
  Administered 2012-03-12: 4 mg via INTRAVENOUS
  Filled 2012-03-12: qty 2

## 2012-03-12 MED ORDER — DEXTROSE 5 % IV SOLN
1.0000 g | Freq: Once | INTRAVENOUS | Status: AC
  Administered 2012-03-12: 1 g via INTRAVENOUS
  Filled 2012-03-12: qty 10

## 2012-03-12 MED ORDER — HYDROMORPHONE HCL PF 1 MG/ML IJ SOLN
1.0000 mg | Freq: Once | INTRAMUSCULAR | Status: AC
  Administered 2012-03-12: 1 mg via INTRAVENOUS
  Filled 2012-03-12: qty 1

## 2012-03-12 MED ORDER — CEPHALEXIN 500 MG PO CAPS: 500.0000 mg | ORAL_CAPSULE | Freq: Four times a day (QID) | ORAL | Status: DC

## 2012-03-12 MED ORDER — PROMETHAZINE HCL 25 MG PO TABS: 25.0000 mg | ORAL_TABLET | Freq: Four times a day (QID) | ORAL | Status: DC | PRN

## 2012-03-12 MED ORDER — SODIUM CHLORIDE 0.9 % IV SOLN: INTRAVENOUS | Status: DC

## 2012-03-12 MED ORDER — HYDROCODONE-ACETAMINOPHEN 5-325 MG PO TABS: 1.0000 | ORAL_TABLET | Freq: Four times a day (QID) | ORAL | Status: DC | PRN

## 2012-03-12 MED ORDER — SODIUM CHLORIDE 0.9 % IV BOLUS (SEPSIS)
500.0000 mL | Freq: Once | INTRAVENOUS | Status: AC
  Administered 2012-03-12: 500 mL via INTRAVENOUS

## 2012-03-12 NOTE — ED Notes (Signed)
Pt was triaged by D.Baron Hamper, RN inadvertently under D.Cole's name.

## 2012-03-12 NOTE — ED Notes (Signed)
Pt's mother is providing her a ride home.

## 2012-03-12 NOTE — ED Provider Notes (Signed)
History  This chart was scribed for Shelda Jakes, MD by Erskine Emery, ED Scribe. This patient was seen in room MH03/MH03 and the patient's care was started at 20:58.   CSN: [id number]  Arrival date & time 03/12/12  2047   First MD Initiated Contact with Patient 03/12/12 2058      Chief Complaint  Patient presents with  . Abdominal Pain    (Consider location/radiation/quality/duration/timing/severity/associated sxs/prior treatment) The history is provided by the patient. No language interpreter was used.  Yvonne Gentry is a 22 y.o. female who presents to the Emergency Department complaining of right sided abdominal pain and lower back pain since yesterday. Pt reports some associated chills, nausea, dizziness, and headache but denies any vaginal discharge, rash, hematuria, neck pain, visual disturbance, emesis, diarrhea, cough, chest pain, shortness of breath, congestion, sore throat, rhinorrhea, or h/o bleeding easily.  Pt also presents with some dysuria and cloudy foul-smelling urine for the past week. Pt did not seek treatment for her symptoms, she just drank a lot of cranberry juice.  Past Medical History  Diagnosis Date  . Asthma   . Pollen allergies     Past Surgical History  Procedure Date  . No past surgeries     Family History  Problem Relation Age of Onset  . Hypertension Father   . Diabetes Brother     History  Substance Use Topics  . Smoking status: Never Smoker   . Smokeless tobacco: Never Used  . Alcohol Use: No    OB History    Grav Para Term Preterm Abortions TAB SAB Ect Mult Living   2 2 2       2       Review of Systems  Constitutional: Positive for chills.  HENT: Negative for congestion, rhinorrhea and neck pain.   Eyes: Negative for visual disturbance.  Respiratory: Negative for cough and shortness of breath.   Cardiovascular: Negative for chest pain.  Gastrointestinal: Positive for nausea and abdominal pain. Negative for vomiting and  diarrhea.  Genitourinary: Negative for vaginal discharge.  Musculoskeletal: Positive for back pain.  Skin: Negative for rash.  Neurological: Positive for dizziness and headaches.  Psychiatric/Behavioral: Negative for sleep disturbance.  All other systems reviewed and are negative.    Allergies  Review of patient's allergies indicates no known allergies.  Home Medications   Current Outpatient Rx  Name  Route  Sig  Dispense  Refill  . CEPHALEXIN 500 MG PO CAPS   Oral   Take 1 capsule (500 mg total) by mouth 4 (four) times daily.   40 capsule   0   . ERYTHROMYCIN 5 MG/GM OP OINT      Place a 1/2 inch ribbon of ointment into the lower eyelid of the left eye.   3.5 g   0   . HYDROCODONE-ACETAMINOPHEN 5-325 MG PO TABS   Oral   Take 1-2 tablets by mouth every 6 (six) hours as needed for pain.   10 tablet   0   . PROMETHAZINE HCL 25 MG RE SUPP   Rectal   Place 1 suppository (25 mg total) rectally every 6 (six) hours as needed for nausea.   12 each   0   . PROMETHAZINE HCL 25 MG PO TABS   Oral   Take 1 tablet (25 mg total) by mouth every 6 (six) hours as needed for nausea.   12 tablet   0     Triage Vitals: BP 113/74  Pulse 120  Temp 98.4 F (36.9 C) (Oral)  Resp 18  Ht 5\' 6"  (1.676 m)  Wt 135 lb (61.236 kg)  BMI 21.79 kg/m2  SpO2 98%  LMP 02/27/2012  Breastfeeding? No  Physical Exam  Nursing note and vitals reviewed. Constitutional: She is oriented to person, place, and time. She appears well-developed and well-nourished. No distress.  HENT:  Head: Normocephalic and atraumatic.  Mouth/Throat: Oropharynx is clear and moist.  Eyes: EOM are normal. Pupils are equal, round, and reactive to light.       Sclera are clear  Neck: Neck supple. No tracheal deviation present.  Cardiovascular: Normal rate, regular rhythm and normal heart sounds.   No murmur heard. Pulmonary/Chest: Effort normal and breath sounds normal. No respiratory distress. She has no wheezes.        Lungs are clear  Abdominal: Soft. Bowel sounds are normal. She exhibits no distension. There is tenderness.       Right sided abdominal, right flank, and right CVA area tenderness.  Musculoskeletal: Normal range of motion. She exhibits no edema.  Neurological: She is alert and oriented to person, place, and time. No cranial nerve deficit. Coordination normal.  Skin: Skin is warm and dry.  Psychiatric: She has a normal mood and affect.    ED Course  Procedures (including critical care time) DIAGNOSTIC STUDIES: Oxygen Saturation is 98% on room air, normal by my interpretation.    COORDINATION OF CARE: 21:05--I evaluated the patient and we discussed a treatment plan including IV antibiotics and possible abdominal CT to which the pt agreed.    Labs Reviewed  URINALYSIS, ROUTINE W REFLEX MICROSCOPIC - Abnormal; Notable for the following:    APPearance CLOUDY (*)     Hgb urine dipstick MODERATE (*)     Ketones, ur 15 (*)     Protein, ur 30 (*)     Nitrite POSITIVE (*)     Leukocytes, UA MODERATE (*)     All other components within normal limits  CBC WITH DIFFERENTIAL - Abnormal; Notable for the following:    WBC 10.8 (*)     Neutro Abs 7.9 (*)     Monocytes Relative 14 (*)     Monocytes Absolute 1.5 (*)     All other components within normal limits  COMPREHENSIVE METABOLIC PANEL - Abnormal; Notable for the following:    Potassium 3.4 (*)     Glucose, Bld 111 (*)     All other components within normal limits  URINE MICROSCOPIC-ADD ON - Abnormal; Notable for the following:    Bacteria, UA MANY (*)     All other components within normal limits  PREGNANCY, URINE  LIPASE, BLOOD  URINE CULTURE   No results found. Results for orders placed during the hospital encounter of 03/12/12  URINALYSIS, ROUTINE W REFLEX MICROSCOPIC      Component Value Range   Color, Urine YELLOW  YELLOW   APPearance CLOUDY (*) CLEAR   Specific Gravity, Urine 1.015  1.005 - 1.030   pH 7.0  5.0 -  8.0   Glucose, UA NEGATIVE  NEGATIVE mg/dL   Hgb urine dipstick MODERATE (*) NEGATIVE   Bilirubin Urine NEGATIVE  NEGATIVE   Ketones, ur 15 (*) NEGATIVE mg/dL   Protein, ur 30 (*) NEGATIVE mg/dL   Urobilinogen, UA 0.2  0.0 - 1.0 mg/dL   Nitrite POSITIVE (*) NEGATIVE   Leukocytes, UA MODERATE (*) NEGATIVE  PREGNANCY, URINE      Component Value Range   Preg Test, Ur  NEGATIVE  NEGATIVE  CBC WITH DIFFERENTIAL      Component Value Range   WBC 10.8 (*) 4.0 - 10.5 K/uL   RBC 4.59  3.87 - 5.11 MIL/uL   Hemoglobin 13.1  12.0 - 15.0 g/dL   HCT 44.0  10.2 - 72.5 %   MCV 82.4  78.0 - 100.0 fL   MCH 28.5  26.0 - 34.0 pg   MCHC 34.7  30.0 - 36.0 g/dL   RDW 36.6  44.0 - 34.7 %   Platelets 262  150 - 400 K/uL   Neutrophils Relative 73  43 - 77 %   Neutro Abs 7.9 (*) 1.7 - 7.7 K/uL   Lymphocytes Relative 13  12 - 46 %   Lymphs Abs 1.4  0.7 - 4.0 K/uL   Monocytes Relative 14 (*) 3 - 12 %   Monocytes Absolute 1.5 (*) 0.1 - 1.0 K/uL   Eosinophils Relative 1  0 - 5 %   Eosinophils Absolute 0.1  0.0 - 0.7 K/uL   Basophils Relative 0  0 - 1 %   Basophils Absolute 0.0  0.0 - 0.1 K/uL  COMPREHENSIVE METABOLIC PANEL      Component Value Range   Sodium 139  135 - 145 mEq/L   Potassium 3.4 (*) 3.5 - 5.1 mEq/L   Chloride 104  96 - 112 mEq/L   CO2 24  19 - 32 mEq/L   Glucose, Bld 111 (*) 70 - 99 mg/dL   BUN 10  6 - 23 mg/dL   Creatinine, Ser 4.25  0.50 - 1.10 mg/dL   Calcium 9.5  8.4 - 95.6 mg/dL   Total Protein 7.5  6.0 - 8.3 g/dL   Albumin 3.7  3.5 - 5.2 g/dL   AST 14  0 - 37 U/L   ALT 8  0 - 35 U/L   Alkaline Phosphatase 99  39 - 117 U/L   Total Bilirubin 0.6  0.3 - 1.2 mg/dL   GFR calc non Af Amer >90  >90 mL/min   GFR calc Af Amer >90  >90 mL/min  LIPASE, BLOOD      Component Value Range   Lipase 21  11 - 59 U/L  URINE MICROSCOPIC-ADD ON      Component Value Range   Squamous Epithelial / LPF RARE  RARE   WBC, UA TOO NUMEROUS TO COUNT  <3 WBC/hpf   RBC / HPF 7-10  <3 RBC/hpf    Bacteria, UA MANY (*) RARE   Urine-Other MUCOUS PRESENT       1. Pyelonephritis       MDM   Patient's urinalysis consistent with urinary tract infection urine culture sent. Patients clinical status symptoms are consistent with pyelonephritis with right-sided pain and right-sided back pain. Also with some chills. And some nausea. Patient nontoxic no acute distress in emergency partner treated with 1 g of Rocephin IV piggyback also treated with pain medicine and antinausea medicine. We discharged home with Keflex for the next 10 days Phenergan for nausea and hydrocodone as needed for pain. Patient understands it very important that she is better in 2 days if she is not she must return. Patient's pregnancy test was negative.      I personally performed the services described in this documentation, which was scribed in my presence. The recorded information has been reviewed and is accurate.     Shelda Jakes, MD 03/12/12 2220

## 2012-03-12 NOTE — ED Notes (Signed)
Pt is trying to get a ride home.

## 2012-03-12 NOTE — ED Notes (Signed)
Pt describes right side abd and lower back pain onset yesterday. Chills. Urinary s/s. Denies vaginal d/c.

## 2012-03-14 LAB — URINE CULTURE: Colony Count: >=100000

## 2012-03-15 NOTE — ED Notes (Signed)
+   Urine Patient treated with Keflex-sensitive to same-chart appended per protocol MD. 

## 2012-04-02 ENCOUNTER — Emergency Department (HOSPITAL_BASED_OUTPATIENT_CLINIC_OR_DEPARTMENT_OTHER)
Admission: EM | Admit: 2012-04-02 | Discharge: 2012-04-02 | Disposition: A | Discharge status: Home / Self Care | Payer: Self-pay | Attending: Emergency Medicine | Admitting: Emergency Medicine

## 2012-04-02 ENCOUNTER — Encounter (HOSPITAL_BASED_OUTPATIENT_CLINIC_OR_DEPARTMENT_OTHER): Payer: Self-pay | Admitting: *Deleted

## 2012-04-02 DIAGNOSIS — J45909 Unspecified asthma, uncomplicated: Secondary | ICD-10-CM | POA: Insufficient documentation

## 2012-04-02 DIAGNOSIS — A6000 Herpesviral infection of urogenital system, unspecified: Secondary | ICD-10-CM | POA: Insufficient documentation

## 2012-04-02 DIAGNOSIS — Z8709 Personal history of other diseases of the respiratory system: Secondary | ICD-10-CM | POA: Insufficient documentation

## 2012-04-02 DIAGNOSIS — L299 Pruritus, unspecified: Secondary | ICD-10-CM | POA: Insufficient documentation

## 2012-04-02 DIAGNOSIS — Z3202 Encounter for pregnancy test, result negative: Secondary | ICD-10-CM | POA: Insufficient documentation

## 2012-04-02 DIAGNOSIS — Z79899 Other long term (current) drug therapy: Secondary | ICD-10-CM | POA: Insufficient documentation

## 2012-04-02 LAB — URINALYSIS, ROUTINE W REFLEX MICROSCOPIC
Bilirubin Urine: NEGATIVE
Hgb urine dipstick: NEGATIVE
Ketones, ur: NEGATIVE mg/dL
Nitrite: NEGATIVE
Urobilinogen, UA: 1 mg/dL (ref 0.0–1.0)
pH: 7.5 (ref 5.0–8.0)

## 2012-04-02 MED ORDER — VALACYCLOVIR HCL 500 MG PO TABS: 1000.0000 mg | ORAL_TABLET | Freq: Two times a day (BID) | ORAL | Status: DC

## 2012-04-02 NOTE — ED Provider Notes (Signed)
History/physical exam/procedure(s) were performed by non-physician practitioner and as supervising physician I was immediately available for consultation/collaboration. I have reviewed all notes and am in agreement with care and plan.   Hilario Quarry, MD 04/02/12 [phone]

## 2012-04-02 NOTE — ED Notes (Signed)
Rash to anal area x 1 week. Was taking antibiotics previous for UTI. Was written a rx for this, but unable to fill due to price. Took over the counter pin/X OTC, with no improvement. Has used vagisil to protect area. Painful to touch and to walk.

## 2012-04-02 NOTE — ED Notes (Addendum)
Pt. C/o of rash in her anal area that started one week ago. States red bumps formed a few days ago. Denies any hx of STD. Denies any fevers. Denies any urinary symptoms. C/o pain to that area. Has recently been treated with an antibiotic.

## 2012-04-02 NOTE — ED Notes (Signed)
MD at bedside. 

## 2012-04-02 NOTE — ED Provider Notes (Signed)
History     CSN: [id number]  Arrival date & time 04/02/12  1308   First MD Initiated Contact with Patient 04/02/12 2117      Chief Complaint  Patient presents with  . Rash    (Consider location/radiation/quality/duration/timing/severity/associated sxs/prior treatment) HPI Comments: Patient is a 22 year old female who presents with a rash for the past week. The rash started gradually and progressively worsened since the onset. Irritation of the area preceeded the rash eruption. The rash is located between her buttocks. Patient has tried OTC ointment without relief. Patient denies new exposures to medications, soaps, lotions, detergent. Patient reports associated occasional itching. No aggravating/alleviating factors. Patient denies fever, chills, NVD, sore throat, oral lesions, ocular involvement, throat closing, wheezing, SOB, chest pain, abdominal pain. Patient reports being sexually active but using protection.     Patient is a 22 y.o. female presenting with rash.  Rash   Past Medical History  Diagnosis Date  . Asthma   . Pollen allergies     Past Surgical History  Procedure Laterality Date  . No past surgeries      Family History  Problem Relation Age of Onset  . Hypertension Father   . Diabetes Brother     History  Substance Use Topics  . Smoking status: Never Smoker   . Smokeless tobacco: Never Used  . Alcohol Use: No    OB History   Grav Para Term Preterm Abortions TAB SAB Ect Mult Living   2 2 2       2       Review of Systems  Skin: Positive for rash.  All other systems reviewed and are negative.    Allergies  Review of patient's allergies indicates no known allergies.  Home Medications   Current Outpatient Rx  Name  Route  Sig  Dispense  Refill  . cephALEXin (KEFLEX) 500 MG capsule   Oral   Take 1 capsule (500 mg total) by mouth 4 (four) times daily.   40 capsule   0   . erythromycin ophthalmic ointment      Place a 1/2 inch ribbon of  ointment into the lower eyelid of the left eye.   3.5 g   0   . HYDROcodone-acetaminophen (NORCO/VICODIN) 5-325 MG per tablet   Oral   Take 1-2 tablets by mouth every 6 (six) hours as needed for pain.   10 tablet   0   . EXPIRED: promethazine (PHENERGAN) 25 MG suppository   Rectal   Place 1 suppository (25 mg total) rectally every 6 (six) hours as needed for nausea.   12 each   0   . promethazine (PHENERGAN) 25 MG tablet   Oral   Take 1 tablet (25 mg total) by mouth every 6 (six) hours as needed for nausea.   12 tablet   0     BP 117/75  Pulse 74  Temp(Src) 98.2 F (36.8 C) (Oral)  Resp 18  Ht 5\' 6"  (1.676 m)  Wt 135 lb (61.236 kg)  BMI 21.8 kg/m2  SpO2 99%  LMP 03/17/2012  Physical Exam  Nursing note and vitals reviewed. Constitutional: She is oriented to person, place, and time. She appears well-developed and well-nourished. No distress.  HENT:  Head: Normocephalic and atraumatic.  Eyes: Conjunctivae and EOM are normal.  Neck: Normal range of motion.  Cardiovascular: Normal rate and regular rhythm.  Exam reveals no gallop and no friction rub.   No murmur heard. Pulmonary/Chest: Effort normal and breath  sounds normal. She has no wheezes. She has no rales. She exhibits no tenderness.  Abdominal: Soft. There is no tenderness.  Musculoskeletal: Normal range of motion.  Neurological: She is alert and oriented to person, place, and time. Coordination normal.  Speech is goal-oriented. Moves limbs without ataxia.   Skin: Skin is warm and dry.  Herpetic lesions scattered between skin folds of buttocks.   Psychiatric: She has a normal mood and affect. Her behavior is normal.    ED Course  Procedures (including critical care time)  Labs Reviewed  URINALYSIS, ROUTINE W REFLEX MICROSCOPIC  PREGNANCY, URINE   No results found.   1. Genital herpes       MDM  9:54 PM Dr. Rosalia Hammers saw the patient with me who thinks the patient has herpes. Patient will be discharged  with Acyclovir. No further evaluation needed at this time.         Emilia Beck, [place] 04/02/12 2159

## 2012-04-03 NOTE — Progress Notes (Signed)
   CARE MANAGEMENT ED NOTE 04/03/2012  Patient:  KARALINE, BURESH   Account Number:  [id number]  Date Initiated:  04/03/2012  Documentation initiated by:  Fransico Michael  Subjective/Objective Assessment:   seen yesterday at [facility] with c/o rash     Subjective/Objective Assessment Detail:     Action/Plan:   Action/Plan Detail:   Anticipated DC Date:  04/02/2012     Status Recommendation to Physician:   Result of Recommendation:      DC Planning Services  Medication Assistance    Choice offered to / List presented to:            Status of service:  Completed, signed off  ED Comments:   ED Comments Detail:  04/03/12-1327-J.Maurene Hollin,RN,BSN [phone]     Spoke with Dr. Judd Lien in ED regarding pharmacy question. He talked to pharmacist, Tresa Endo directly. No further issues noted.  04/03/12-1253-J.Valmore Arabie,RN,BSN [phone]     Received call from Okey Regal at pharmacy at [facility]. Requesting that prescription be changed to Zovirax instead of Valtrex. States, " The valtrex is $80 and she says she can't afford that. Can we change it to Zovirax 200mg ?"

## 2012-10-31 ENCOUNTER — Emergency Department (HOSPITAL_BASED_OUTPATIENT_CLINIC_OR_DEPARTMENT_OTHER): Payer: Medicaid Other

## 2012-10-31 ENCOUNTER — Emergency Department (HOSPITAL_BASED_OUTPATIENT_CLINIC_OR_DEPARTMENT_OTHER)
Admission: EM | Admit: 2012-10-31 | Discharge: 2012-10-31 | Disposition: A | Discharge status: Home / Self Care | Payer: Medicaid Other | Attending: Emergency Medicine | Admitting: Emergency Medicine

## 2012-10-31 ENCOUNTER — Emergency Department (HOSPITAL_BASED_OUTPATIENT_CLINIC_OR_DEPARTMENT_OTHER): Payer: Self-pay

## 2012-10-31 ENCOUNTER — Encounter (HOSPITAL_BASED_OUTPATIENT_CLINIC_OR_DEPARTMENT_OTHER): Payer: Self-pay | Admitting: Emergency Medicine

## 2012-10-31 DIAGNOSIS — S59909A Unspecified injury of unspecified elbow, initial encounter: Secondary | ICD-10-CM | POA: Insufficient documentation

## 2012-10-31 DIAGNOSIS — S6990XA Unspecified injury of unspecified wrist, hand and finger(s), initial encounter: Secondary | ICD-10-CM | POA: Insufficient documentation

## 2012-10-31 DIAGNOSIS — W010XXA Fall on same level from slipping, tripping and stumbling without subsequent striking against object, initial encounter: Secondary | ICD-10-CM | POA: Insufficient documentation

## 2012-10-31 DIAGNOSIS — R4020 Unspecified coma: Secondary | ICD-10-CM

## 2012-10-31 DIAGNOSIS — M25521 Pain in right elbow: Secondary | ICD-10-CM

## 2012-10-31 DIAGNOSIS — S0990XA Unspecified injury of head, initial encounter: Secondary | ICD-10-CM

## 2012-10-31 DIAGNOSIS — Z79899 Other long term (current) drug therapy: Secondary | ICD-10-CM | POA: Insufficient documentation

## 2012-10-31 DIAGNOSIS — S060X1A Concussion with loss of consciousness of 30 minutes or less, initial encounter: Secondary | ICD-10-CM | POA: Insufficient documentation

## 2012-10-31 DIAGNOSIS — Y92009 Unspecified place in unspecified non-institutional (private) residence as the place of occurrence of the external cause: Secondary | ICD-10-CM | POA: Insufficient documentation

## 2012-10-31 DIAGNOSIS — Y939 Activity, unspecified: Secondary | ICD-10-CM | POA: Insufficient documentation

## 2012-10-31 DIAGNOSIS — J45909 Unspecified asthma, uncomplicated: Secondary | ICD-10-CM | POA: Insufficient documentation

## 2012-10-31 MED ORDER — HYDROCODONE-ACETAMINOPHEN 5-325 MG PO TABS
1.0000 | ORAL_TABLET | Freq: Once | ORAL | Status: DC
  Filled 2012-10-31: qty 1

## 2012-10-31 MED ORDER — NAPROXEN 250 MG PO TABS
500.0000 mg | ORAL_TABLET | Freq: Once | ORAL | Status: AC
Start: 2012-10-31 — End: 2012-10-31
  Administered 2012-10-31: 500 mg via ORAL
  Filled 2012-10-31: qty 2

## 2012-10-31 MED ORDER — NAPROXEN 500 MG PO TABS: 500.0000 mg | ORAL_TABLET | Freq: Two times a day (BID) | ORAL | Status: DC

## 2012-10-31 NOTE — ED Notes (Addendum)
Pt sts she was bent down under the island in the kitchen last night and came up and hit her head on it, causing her to "black out for 5 minutes".  Sts she was told by the people with her that when she came up she hit her right arm on something.  She does not know what.  Today she is having severe pain just above the right elbow and is not able to bend her elbow or lift her arm at the shoulder. Denies n/v or visual changes.  Reports slight HA.

## 2012-10-31 NOTE — ED Provider Notes (Signed)
CSN: [id number]     Arrival date & time 10/31/12  1421 History   First MD Initiated Contact with Patient 10/31/12 1548     Chief Complaint  Patient presents with  . Arm Pain   (Consider location/radiation/quality/duration/timing/severity/associated sxs/prior Treatment) HPI  Yvonne Gentry is a 22 y.o. female complaining of a headache and right arm pain after hitting her head last night. She reports that she hit the back of her head on her kitchen counter, stumbled, and fell into the door frame which caused her to lose consciousness for "about 5 minutes." She woke up in her bed with a mild headache, and 7/10 pain in her right elbow. She describes her headache as dull and achy, and is controlled with ibuprofen. Her arm pain is located over her right elbow, radiating to the middle of her triceps muscle. It is made worse with movement. Ibuprofen provides no relief for this pain.   Past Medical History  Diagnosis Date  . Asthma   . Pollen allergies    Past Surgical History  Procedure Laterality Date  . No past surgeries     Family History  Problem Relation Age of Onset  . Hypertension Father   . Diabetes Brother    History  Substance Use Topics  . Smoking status: Never Smoker   . Smokeless tobacco: Never Used  . Alcohol Use: Yes     Comment: occ   OB History   Grav Para Term Preterm Abortions TAB SAB Ect Mult Living   2 2 2       2      Review of Systems 10 systems reviewed and found to be negative, except as noted in the HPI  Allergies  Review of patient's allergies indicates no known allergies.  Home Medications   Current Outpatient Rx  Name  Route  Sig  Dispense  Refill  . cephALEXin (KEFLEX) 500 MG capsule   Oral   Take 1 capsule (500 mg total) by mouth 4 (four) times daily.   40 capsule   0   . erythromycin ophthalmic ointment      Place a 1/2 inch ribbon of ointment into the lower eyelid of the left eye.   3.5 g   0   . HYDROcodone-acetaminophen  (NORCO/VICODIN) 5-325 MG per tablet   Oral   Take 1-2 tablets by mouth every 6 (six) hours as needed for pain.   10 tablet   0   . EXPIRED: promethazine (PHENERGAN) 25 MG suppository   Rectal   Place 1 suppository (25 mg total) rectally every 6 (six) hours as needed for nausea.   12 each   0   . promethazine (PHENERGAN) 25 MG tablet   Oral   Take 1 tablet (25 mg total) by mouth every 6 (six) hours as needed for nausea.   12 tablet   0   . valACYclovir (VALTREX) 500 MG tablet   Oral   Take 2 tablets (1,000 mg total) by mouth 2 (two) times daily.   14 tablet   0    BP 111/72  Pulse 74  Temp(Src) 98.1 F (36.7 C) (Oral)  Resp 16  Ht 5\' 6"  (1.676 m)  Wt 130 lb (58.968 kg)  BMI 20.99 kg/m2  SpO2 100%  LMP 10/23/2012  Breastfeeding? No Physical Exam  Nursing note and vitals reviewed. Constitutional: She is oriented to person, place, and time. She appears well-developed and well-nourished. No distress.  HENT:  Head: Normocephalic.  Mouth/Throat: Oropharynx is  clear and moist.  Eyes: Conjunctivae and EOM are normal. Pupils are equal, round, and reactive to light.  Neck: Normal range of motion.  No midline tenderness to palpation or step-offs appreciated. Patient has full range of motion without pain.   Cardiovascular: Normal rate.   Pulmonary/Chest: Effort normal and breath sounds normal. No stridor. No respiratory distress. She has no wheezes. She has no rales. She exhibits no tenderness.  Musculoskeletal: Normal range of motion. She exhibits tenderness. She exhibits no edema.  Full range of motion to right elbow with no contusions or deformities. Neurovascularly intact. Patient is diffusely tender to palpation along the olecranon and proximal ulna  Neurological: She is alert and oriented to person, place, and time.  Cranial nerves III through XII intact, strength 5 out of 5x4 extremities, negative pronator drift, finger to nose and heel-to-shin coordinated, sensation  intact to pinprick and light touch, gait is coordinated and Romberg is negative.    Psychiatric: She has a normal mood and affect.    ED Course  Procedures (including critical care time) Labs Review Labs Reviewed - No data to display Imaging Review Dg Elbow Complete Right  10/31/2012   CLINICAL DATA:  Pain post trauma  EXAM: RIGHT ELBOW - COMPLETE 3+ VIEW  COMPARISON:  None.  FINDINGS: Frontal, lateral, and bilateral oblique views were obtained. There is no fracture, dislocation, or effusion. Joint spaces appear intact. No erosive change.  IMPRESSION: No abnormality noted.   Electronically Signed   By: Bretta Bang   On: 10/31/2012 15:44    MDM  No diagnosis found. Filed Vitals:   10/31/12 1449  BP: 111/72  Pulse: 74  Temp: 98.1 F (36.7 C)  TempSrc: Oral  Resp: 16  Height: 5\' 6"  (1.676 m)  Weight: 130 lb (58.968 kg)  SpO2: 100%     Yvonne Gentry is a 22 y.o. female with right elbow pain and head trauma resulting in loss of consciousness yesterday. Neuro exam is nonfocal and head CT shows no abnormalities. Plain film of the elbow shows no fracture. Patient's excellent range of motion and no signs of trauma to the elbow. Patient will be given a sling and advised to take Aleve for pain control.   Medications  naproxen (NAPROSYN) tablet 500 mg (500 mg Oral Given 10/31/12 1649)    Pt is hemodynamically stable, appropriate for, and amenable to discharge at this time. Pt verbalized understanding and agrees with care plan. All questions answered. Outpatient follow-up and specific return precautions discussed.    Discharge Medication List as of 10/31/2012  4:43 PM    START taking these medications   Details  naproxen (NAPROSYN) 500 MG tablet Take 1 tablet (500 mg total) by mouth 2 (two) times daily with a meal., Starting 10/31/2012, Until Discontinued, Print        Note: Portions of this report may have been transcribed using voice recognition software. Every effort was  made to ensure accuracy; however, inadvertent computerized transcription errors may be present      Wynetta Emery, PA-C 10/31/12 1946

## 2012-10-31 NOTE — ED Notes (Signed)
Pt does not have driver-vicodin d/c and naprosyn ordered per EDPA

## 2012-10-31 NOTE — ED Provider Notes (Signed)
Medical screening examination/treatment/procedure(s) were performed by non-physician practitioner and as supervising physician I was immediately available for consultation/collaboration.   Audree Camel, MD 10/31/12 2216

## 2013-01-15 ENCOUNTER — Encounter (HOSPITAL_BASED_OUTPATIENT_CLINIC_OR_DEPARTMENT_OTHER): Payer: Self-pay | Admitting: Emergency Medicine

## 2013-01-15 ENCOUNTER — Emergency Department (HOSPITAL_BASED_OUTPATIENT_CLINIC_OR_DEPARTMENT_OTHER)
Admission: EM | Admit: 2013-01-15 | Discharge: 2013-01-15 | Disposition: A | Discharge status: Home / Self Care | Payer: No Typology Code available for payment source | Attending: Emergency Medicine | Admitting: Emergency Medicine

## 2013-01-15 DIAGNOSIS — R21 Rash and other nonspecific skin eruption: Secondary | ICD-10-CM

## 2013-01-15 DIAGNOSIS — Z792 Long term (current) use of antibiotics: Secondary | ICD-10-CM | POA: Insufficient documentation

## 2013-01-15 DIAGNOSIS — Z9109 Other allergy status, other than to drugs and biological substances: Secondary | ICD-10-CM | POA: Insufficient documentation

## 2013-01-15 DIAGNOSIS — L509 Urticaria, unspecified: Secondary | ICD-10-CM | POA: Insufficient documentation

## 2013-01-15 DIAGNOSIS — J45909 Unspecified asthma, uncomplicated: Secondary | ICD-10-CM | POA: Insufficient documentation

## 2013-01-15 DIAGNOSIS — Z79899 Other long term (current) drug therapy: Secondary | ICD-10-CM | POA: Insufficient documentation

## 2013-01-15 DIAGNOSIS — Z791 Long term (current) use of non-steroidal anti-inflammatories (NSAID): Secondary | ICD-10-CM | POA: Insufficient documentation

## 2013-01-15 MED ORDER — HYDROXYZINE HCL 25 MG PO TABS: 25.0000 mg | ORAL_TABLET | Freq: Four times a day (QID) | ORAL | Status: DC

## 2013-01-15 MED ORDER — DEXAMETHASONE SODIUM PHOSPHATE 10 MG/ML IJ SOLN
10.0000 mg | Freq: Once | INTRAMUSCULAR | Status: AC
  Administered 2013-01-15: 10 mg via INTRAMUSCULAR
  Filled 2013-01-15: qty 1

## 2013-01-15 NOTE — ED Provider Notes (Signed)
CSN: [id number]     Arrival date & time 01/15/13  2038 History   First MD Initiated Contact with Patient 01/15/13 2053     Chief Complaint  Patient presents with  . Rash   (Consider location/radiation/quality/duration/timing/severity/associated sxs/prior Treatment) HPI Comments: Patient is a 22 year old female with a past medical history of asthma who presents to the emergency department complaining of an itchy rash x2 days. Patient states she first noticed hives on both of her forearms, then the rash spread to her legs and abdomen. Denies difficulty breathing or swallowing. She has tried taking Benadryl without relief. Denies any new soaps, detergents, pets, foods, recent medications, contacts with similar rash. She lives at home with her children who do not have a similar rash.  Patient is a 22 y.o. female presenting with rash. The history is provided by the patient.  Rash   Past Medical History  Diagnosis Date  . Asthma   . Pollen allergies    Past Surgical History  Procedure Laterality Date  . No past surgeries     Family History  Problem Relation Age of Onset  . Hypertension Father   . Diabetes Brother    History  Substance Use Topics  . Smoking status: Never Smoker   . Smokeless tobacco: Never Used  . Alcohol Use: Yes     Comment: occ   OB History   Grav Para Term Preterm Abortions TAB SAB Ect Mult Living   2 2 2       2      Review of Systems  Skin: Positive for rash.  All other systems reviewed and are negative.    Allergies  Review of patient's allergies indicates no known allergies.  Home Medications   Current Outpatient Rx  Name  Route  Sig  Dispense  Refill  . cephALEXin (KEFLEX) 500 MG capsule   Oral   Take 1 capsule (500 mg total) by mouth 4 (four) times daily.   40 capsule   0   . erythromycin ophthalmic ointment      Place a 1/2 inch ribbon of ointment into the lower eyelid of the left eye.   3.5 g   0   . HYDROcodone-acetaminophen  (NORCO/VICODIN) 5-325 MG per tablet   Oral   Take 1-2 tablets by mouth every 6 (six) hours as needed for pain.   10 tablet   0   . naproxen (NAPROSYN) 500 MG tablet   Oral   Take 1 tablet (500 mg total) by mouth 2 (two) times daily with a meal.   30 tablet   0   . EXPIRED: promethazine (PHENERGAN) 25 MG suppository   Rectal   Place 1 suppository (25 mg total) rectally every 6 (six) hours as needed for nausea.   12 each   0   . promethazine (PHENERGAN) 25 MG tablet   Oral   Take 1 tablet (25 mg total) by mouth every 6 (six) hours as needed for nausea.   12 tablet   0   . valACYclovir (VALTREX) 500 MG tablet   Oral   Take 2 tablets (1,000 mg total) by mouth 2 (two) times daily.   14 tablet   0    BP 127/87  Pulse 77  Temp(Src) 98.5 F (36.9 C) (Oral)  Resp 18  Ht 5\' 5"  (1.651 m)  Wt 125 lb (56.7 kg)  BMI 20.80 kg/m2  SpO2 99% Physical Exam  Nursing note and vitals reviewed. Constitutional: She is oriented to person,  place, and time. She appears well-developed and well-nourished. No distress.  HENT:  Head: Normocephalic and atraumatic.  Mouth/Throat: Oropharynx is clear and moist.  Eyes: Conjunctivae are normal.  Neck: Normal range of motion. Neck supple. No tracheal deviation present.  Cardiovascular: Normal rate, regular rhythm and normal heart sounds.   Pulmonary/Chest: Effort normal and breath sounds normal. No respiratory distress.  Abdominal: Soft. Bowel sounds are normal. There is no tenderness.  Musculoskeletal: Normal range of motion. She exhibits no edema.  Neurological: She is alert and oriented to person, place, and time.  Skin: Skin is warm and dry. She is not diaphoretic.  Scattered urticarial rash on bilateral forearms, legs, abdomen. Spares palms of hands, soles of feet. No rash in web spaces of fingers and toes.  Psychiatric: She has a normal mood and affect. Her behavior is normal.    ED Course  Procedures (including critical care time) Labs  Review Labs Reviewed - No data to display Imaging Review No results found.  EKG Interpretation   None       MDM   1. Rash   2. Urticaria    Pt presenting with hives. Hx of asthma. No resp/airway compromise. Well appearing and in NAD, normal vitals. Decadron given in ED. D/c with vistaril. Return precautions discussed. Patient states understanding of treatment care plan and is agreeable.     Trevor Mace, PA-C 01/15/13 2118

## 2013-01-15 NOTE — ED Notes (Signed)
Hives on and off x 3 days. No resp distress.

## 2013-01-17 NOTE — ED Provider Notes (Signed)
Medical screening examination/treatment/procedure(s) were performed by non-physician practitioner and as supervising physician I was immediately available for consultation/collaboration.  EKG Interpretation   None        Juliet Rude. Rubin Payor, MD 01/17/13 [phone]

## 2013-10-10 ENCOUNTER — Emergency Department (HOSPITAL_BASED_OUTPATIENT_CLINIC_OR_DEPARTMENT_OTHER)
Admission: EM | Admit: 2013-10-10 | Discharge: 2013-10-10 | Disposition: A | Discharge status: Home / Self Care | Payer: Medicaid Other | Attending: Emergency Medicine | Admitting: Emergency Medicine

## 2013-10-10 ENCOUNTER — Encounter (HOSPITAL_BASED_OUTPATIENT_CLINIC_OR_DEPARTMENT_OTHER): Payer: Self-pay | Admitting: Emergency Medicine

## 2013-10-10 DIAGNOSIS — J45909 Unspecified asthma, uncomplicated: Secondary | ICD-10-CM | POA: Insufficient documentation

## 2013-10-10 DIAGNOSIS — Z3202 Encounter for pregnancy test, result negative: Secondary | ICD-10-CM | POA: Insufficient documentation

## 2013-10-10 DIAGNOSIS — N898 Other specified noninflammatory disorders of vagina: Secondary | ICD-10-CM | POA: Insufficient documentation

## 2013-10-10 DIAGNOSIS — N739 Female pelvic inflammatory disease, unspecified: Secondary | ICD-10-CM

## 2013-10-10 DIAGNOSIS — Z79899 Other long term (current) drug therapy: Secondary | ICD-10-CM | POA: Insufficient documentation

## 2013-10-10 DIAGNOSIS — R109 Unspecified abdominal pain: Secondary | ICD-10-CM | POA: Insufficient documentation

## 2013-10-10 DIAGNOSIS — Z792 Long term (current) use of antibiotics: Secondary | ICD-10-CM | POA: Insufficient documentation

## 2013-10-10 LAB — URINALYSIS, ROUTINE W REFLEX MICROSCOPIC
Bilirubin Urine: NEGATIVE
Glucose, UA: NEGATIVE mg/dL
Hgb urine dipstick: NEGATIVE
KETONES UR: NEGATIVE mg/dL
LEUKOCYTES UA: NEGATIVE
NITRITE: NEGATIVE
PH: 6.5 (ref 5.0–8.0)
Protein, ur: NEGATIVE mg/dL
Specific Gravity, Urine: 1.026 (ref 1.005–1.030)
UROBILINOGEN UA: 0.2 mg/dL (ref 0.0–1.0)

## 2013-10-10 LAB — PREGNANCY, URINE: Preg Test, Ur: NEGATIVE

## 2013-10-10 LAB — WET PREP, GENITAL
Trich, Wet Prep: NONE SEEN
Yeast Wet Prep HPF POC: NONE SEEN

## 2013-10-10 MED ORDER — LIDOCAINE HCL (PF) 1 % IJ SOLN
INTRAMUSCULAR | Status: AC
  Administered 2013-10-10: 5 mL
  Filled 2013-10-10: qty 5

## 2013-10-10 MED ORDER — CEFTRIAXONE SODIUM 250 MG IJ SOLR
250.0000 mg | Freq: Once | INTRAMUSCULAR | Status: AC
  Administered 2013-10-10: 250 mg via INTRAMUSCULAR
  Filled 2013-10-10: qty 250

## 2013-10-10 MED ORDER — DOXYCYCLINE HYCLATE 100 MG PO CAPS: 100.0000 mg | ORAL_CAPSULE | Freq: Two times a day (BID) | ORAL | Status: DC

## 2013-10-10 MED ORDER — DOXYCYCLINE HYCLATE 100 MG PO TABS
100.0000 mg | ORAL_TABLET | Freq: Once | ORAL | Status: AC
Start: 2013-10-10 — End: 2013-10-10
  Administered 2013-10-10: 100 mg via ORAL
  Filled 2013-10-10: qty 1

## 2013-10-10 NOTE — Discharge Instructions (Signed)
Return to the ED with any concerns including fever/chills, vomiting and not able to keep down antibiotics, decreased level of alertness/lethargy, or any other alarming symptoms °

## 2013-10-10 NOTE — ED Provider Notes (Signed)
CSN: [id number]     Arrival date & time 10/10/13  0831 History   First MD Initiated Contact with Patient 10/10/13 [phone]     Chief Complaint  Patient presents with  . Vaginal Discharge     (Consider location/radiation/quality/duration/timing/severity/associated sxs/prior Treatment) HPI Pt presents with c/o which vaginal discharge and some lower abdominal pain.  Pt states he noted the symptoms over the past several days.  No fever/chills.  No vaginal bleeding.  No vomiting.  Denies dysuria or changes in bowel movements.  There are no other associated systemic symptoms, there are no other alleviating or modifying factors.   Past Medical History  Diagnosis Date  . Asthma   . Pollen allergies    Past Surgical History  Procedure Laterality Date  . No past surgeries     Family History  Problem Relation Age of Onset  . Hypertension Father   . Diabetes Brother    History  Substance Use Topics  . Smoking status: Never Smoker   . Smokeless tobacco: Never Used  . Alcohol Use: Yes     Comment: occ   OB History   Grav Para Term Preterm Abortions TAB SAB Ect Mult Living   Review of Systems ROS reviewed and all otherwise negative except for mentioned in HPI    Allergies  Review of patient's allergies indicates no known allergies.  Home Medications   Prior to Admission medications   Medication Sig Start Date End Date Taking? Authorizing Provider  cephALEXin (KEFLEX) 500 MG capsule Take 1 capsule (500 mg total) by mouth 4 (four) times daily. 03/12/12   Vanetta Mulders, MD  doxycycline (VIBRAMYCIN) 100 MG capsule Take 1 capsule (100 mg total) by mouth 2 (two) times daily. 10/10/13   Ethelda Chick, MD  erythromycin ophthalmic ointment Place a 1/2 inch ribbon of ointment into the lower eyelid of the left eye. 12/19/11   April K Palumbo-Rasch, MD  HYDROcodone-acetaminophen (NORCO/VICODIN) 5-325 MG per tablet Take 1-2 tablets by mouth every 6 (six) hours as needed for  pain. 03/12/12   Vanetta Mulders, MD  hydrOXYzine (ATARAX/VISTARIL) 25 MG tablet Take 1 tablet (25 mg total) by mouth every 6 (six) hours. 01/15/13   Trevor Mace, PA-C  naproxen (NAPROSYN) 500 MG tablet Take 1 tablet (500 mg total) by mouth 2 (two) times daily with a meal. 10/31/12   Nicole Pisciotta, PA-C  promethazine (PHENERGAN) 25 MG suppository Place 1 suppository (25 mg total) rectally every 6 (six) hours as needed for nausea. 04/19/11 04/26/11  Elson Areas, PA-C  promethazine (PHENERGAN) 25 MG tablet Take 1 tablet (25 mg total) by mouth every 6 (six) hours as needed for nausea. 03/12/12   Vanetta Mulders, MD  valACYclovir (VALTREX) 500 MG tablet Take 2 tablets (1,000 mg total) by mouth 2 (two) times daily. 04/02/12   Kaitlyn Szekalski, PA-C   BP 124/76  Pulse 76  Temp(Src) 98.4 F (36.9 C) (Oral)  Resp 18  Ht  (1.676 m)  Wt 125 lb (56.7 kg)  BMI 20.19 kg/m2  SpO2 100%  LMP 09/30/2013 Vitals reviewed Physical Exam Physical Examination: General appearance - alert, well appearing, and in no distress Mental status - alert, oriented to person, place, and time Eyes - no conjunctival injection, no scleral icterus Mouth - mucous membranes moist, pharynx normal without lesions Chest - clear to auscultation, no wheezes, rales or rhonchi, symmetric air entry Heart - normal  rate, regular rhythm, normal S1, S2, no murmurs, rubs, clicks or gallops Abdomen - soft, nontender, nondistended, no masses or organomegaly, nabs Pelvic - normal external genitalia, vulva, whitish discharge in vaginal vault, no CMT, mild adnexal tenderness bialterally, no adnexal masses Extremities - peripheral pulses normal, no pedal edema, no clubbing or cyanosis Skin - normal coloration and turgor, no rashes  ED Course  Procedures (including critical care time) Labs Review Labs Reviewed  WET PREP, GENITAL - Abnormal; Notable for the following:    Clue Cells Wet Prep HPF POC FEW (*)    WBC, Wet Prep HPF POC  MODERATE (*)    All other components within normal limits  GC/CHLAMYDIA PROBE AMP  URINALYSIS, ROUTINE W REFLEX MICROSCOPIC  PREGNANCY, URINE    Imaging Review No results found.   EKG Interpretation None      MDM   Final diagnoses:  PID (pelvic inflammatory disease)    Pt presenting with vaginal discharge and lower abdominal pain.  Many WBCs on wet prep and mild ttp in bilateral adnexa so will treat for PID.  Given rocephin IM and doxycycline.  Discharged with strict return precautions.  Pt agreeable with plan.    Ethelda Chick, MD 10/12/13 [phone]

## 2013-10-10 NOTE — ED Notes (Signed)
Reports vaginal discharge, white discharge. Reports lower abdominal cramps.

## 2013-10-11 LAB — GC/CHLAMYDIA PROBE AMP
CT Probe RNA: NEGATIVE
GC PROBE AMP APTIMA: NEGATIVE

## 2013-12-10 ENCOUNTER — Encounter (HOSPITAL_BASED_OUTPATIENT_CLINIC_OR_DEPARTMENT_OTHER): Payer: Self-pay | Admitting: Emergency Medicine

## 2015-02-27 ENCOUNTER — Emergency Department (HOSPITAL_BASED_OUTPATIENT_CLINIC_OR_DEPARTMENT_OTHER)
Admission: EM | Admit: 2015-02-27 | Discharge: 2015-02-27 | Disposition: A | Discharge status: Home / Self Care | Payer: Medicaid Other | Attending: Emergency Medicine | Admitting: Emergency Medicine

## 2015-02-27 ENCOUNTER — Encounter (HOSPITAL_BASED_OUTPATIENT_CLINIC_OR_DEPARTMENT_OTHER): Payer: Self-pay | Admitting: *Deleted

## 2015-02-27 DIAGNOSIS — Z3202 Encounter for pregnancy test, result negative: Secondary | ICD-10-CM | POA: Insufficient documentation

## 2015-02-27 DIAGNOSIS — J45901 Unspecified asthma with (acute) exacerbation: Secondary | ICD-10-CM | POA: Insufficient documentation

## 2015-02-27 DIAGNOSIS — R55 Syncope and collapse: Secondary | ICD-10-CM

## 2015-02-27 LAB — BASIC METABOLIC PANEL WITH GFR
Anion gap: 7 (ref 5–15)
BUN: 13 mg/dL (ref 6–20)
CO2: 25 mmol/L (ref 22–32)
Calcium: 8.6 mg/dL — ABNORMAL LOW (ref 8.9–10.3)
Chloride: 105 mmol/L (ref 101–111)
Creatinine, Ser: 0.61 mg/dL (ref 0.44–1.00)
GFR calc Af Amer: >60 mL/min
GFR calc non Af Amer: >60 mL/min
Glucose, Bld: 93 mg/dL (ref 65–99)
Potassium: 3.7 mmol/L (ref 3.5–5.1)
Sodium: 137 mmol/L (ref 135–145)

## 2015-02-27 LAB — CBG MONITORING, ED: Glucose-Capillary: 82 mg/dL (ref 65–99)

## 2015-02-27 LAB — PREGNANCY, URINE: Preg Test, Ur: NEGATIVE

## 2015-02-27 MED ORDER — SODIUM CHLORIDE 0.9 % IV BOLUS (SEPSIS)
1000.0000 mL | Freq: Once | INTRAVENOUS | Status: AC
  Administered 2015-02-27: 1000 mL via INTRAVENOUS

## 2015-02-27 NOTE — ED Provider Notes (Signed)
CSN: [id number]     Arrival date & time 02/27/15  1100 History   First MD Initiated Contact with Patient 02/27/15 1203     Chief Complaint  Patient presents with  . Near Syncope     (Consider location/radiation/quality/duration/timing/severity/associated sxs/prior Treatment) Patient is a 25 y.o. female presenting with near-syncope.  Near Syncope This is a chronic problem. The current episode started 1 to 2 hours ago. The problem occurs constantly. The problem has been resolved. Associated symptoms include shortness of breath. Pertinent negatives include no chest pain and no headaches. Nothing aggravates the symptoms. Nothing relieves the symptoms. She has tried nothing for the symptoms. The treatment provided no relief.    Past Medical History  Diagnosis Date  . Asthma   . Pollen allergies    Past Surgical History  Procedure Laterality Date  . No past surgeries     Family History  Problem Relation Age of Onset  . Hypertension Father   . Diabetes Brother    Social History  Substance Use Topics  . Smoking status: Never Smoker   . Smokeless tobacco: Never Used  . Alcohol Use: Yes     Comment: occ   OB History    Gravida Para Term Preterm AB TAB SAB Ectopic Multiple Living   Review of Systems  Eyes: Negative for pain.  Respiratory: Positive for cough and shortness of breath.   Cardiovascular: Positive for near-syncope. Negative for chest pain.  Gastrointestinal: Negative for nausea and vomiting.  Neurological: Negative for headaches.  All other systems reviewed and are negative.     Allergies  Review of patient's allergies indicates no known allergies.  Home Medications   Prior to Admission medications   Not on File   BP 120/77 mmHg  Pulse 75  Temp(Src) 98.1 F (36.7 C) (Oral)  Resp 18  SpO2 99% Physical Exam  Constitutional: She appears well-developed and well-nourished.  HENT:  Head: Normocephalic and atraumatic.  Neck: Normal  range of motion.  Cardiovascular: Normal rate and regular rhythm.  Exam reveals no gallop and no friction rub.   No murmur heard. Pulmonary/Chest: No stridor. No respiratory distress.  Abdominal: She exhibits no distension.  Neurological: She is alert.  No altered mental status, able to give full seemingly accurate history.  Face is symmetric, EOM's intact, pupils equal and reactive, vision intact, tongue and uvula midline without deviation Upper and Lower extremity motor 5/5, intact pain perception in distal extremities, 2+ reflexes in biceps, patella and achilles tendons. Finger to nose normal, heel to shin normal. Walks without assistance or evident ataxia.    Skin: Skin is warm and dry.  Nursing note and vitals reviewed.   ED Course  Procedures (including critical care time) Labs Review Labs Reviewed  BASIC METABOLIC PANEL - Abnormal; Notable for the following:    Calcium 8.6 (*)    All other components within normal limits  PREGNANCY, URINE  CBG MONITORING, ED    Imaging Review No results found. I have personally reviewed and evaluated these images and lab results as part of my medical decision-making.   EKG Interpretation   Date/Time:  Thursday February 27 2015 12:28:57 EST Ventricular Rate:  87 PR Interval:  131 QRS Duration: 79 QT Interval:  341 QTC Calculation: 410 R Axis:   72 Text Interpretation:  Sinus rhythm Baseline wander in lead(s) V4 No  previous ECGs available Confirmed by [facility] MD, Barbara Cower [phone])  on 02/27/2015  12:31:50 PM      MDM   Final diagnoses:  Syncope, unspecified syncope type    Likely syncopal v near syncopal episode of uncertain etiology. Patient felt she had an asthma attack that caused her to cough and breath rapidly then subsequently lsot consciousness. Has happened multiple times before per patient and no exact cause identified.  Today, neuro exam normal, heart and lung exam normal. Ecg, cbg, poct preganncy normal so doubt cardiac,  hypoglycemia, ectopic as causes. Has never had an echo for workup, will get oen through pcp. Could also be 2/2 hypocarbia from rapid shallow breathing, but no wheezing, hypoxia or respiratory distress to suggest severe asthma needing treatmetn, may be anxiety related.     Marily Memos, MD 02/27/15 1539

## 2015-02-27 NOTE — ED Notes (Signed)
Patient ambulates to and from restroom without difficulty or needing assistance.

## 2015-02-27 NOTE — ED Notes (Signed)
Per EMS:  Pt stated she smelled something which irritated her airway and she felt she was having an asthma attack.  She left the area to use her inhaler, started to hyperventilate, having tingling in her hands and then fell in the floor.  Pt believes she passed out but is sure she did not hit her head because she "does this so much".

## 2015-02-27 NOTE — ED Notes (Signed)
MD at bedside. 

## 2015-02-27 NOTE — ED Notes (Signed)
Pt is lying supine, awake and alert in nad, resp are easy and reg, reports noticing a perfume smell during class this morning which triggers her asthma. She used her inhaler twice, and walked out of class. Pt states she then had a syncopal episode after her friend helped her sit in a chair.

## 2015-08-14 ENCOUNTER — Encounter (HOSPITAL_BASED_OUTPATIENT_CLINIC_OR_DEPARTMENT_OTHER): Payer: Self-pay | Admitting: *Deleted

## 2015-08-14 ENCOUNTER — Inpatient Hospital Stay (HOSPITAL_COMMUNITY): Payer: Medicaid Other

## 2015-08-14 ENCOUNTER — Inpatient Hospital Stay (HOSPITAL_BASED_OUTPATIENT_CLINIC_OR_DEPARTMENT_OTHER)
Admission: AD | Admit: 2015-08-14 | Discharge: 2015-08-14 | Disposition: A | Discharge status: Home / Self Care | Payer: Medicaid Other | Source: Ambulatory Visit | Attending: Obstetrics and Gynecology | Admitting: Obstetrics and Gynecology

## 2015-08-14 DIAGNOSIS — O9989 Other specified diseases and conditions complicating pregnancy, childbirth and the puerperium: Secondary | ICD-10-CM

## 2015-08-14 DIAGNOSIS — R1012 Left upper quadrant pain: Secondary | ICD-10-CM | POA: Insufficient documentation

## 2015-08-14 DIAGNOSIS — O99512 Diseases of the respiratory system complicating pregnancy, second trimester: Secondary | ICD-10-CM | POA: Diagnosis not present

## 2015-08-14 DIAGNOSIS — Z3490 Encounter for supervision of normal pregnancy, unspecified, unspecified trimester: Secondary | ICD-10-CM

## 2015-08-14 DIAGNOSIS — Z3A01 Less than 8 weeks gestation of pregnancy: Secondary | ICD-10-CM | POA: Insufficient documentation

## 2015-08-14 DIAGNOSIS — O26899 Other specified pregnancy related conditions, unspecified trimester: Secondary | ICD-10-CM

## 2015-08-14 DIAGNOSIS — J45909 Unspecified asthma, uncomplicated: Secondary | ICD-10-CM | POA: Diagnosis not present

## 2015-08-14 DIAGNOSIS — R109 Unspecified abdominal pain: Secondary | ICD-10-CM

## 2015-08-14 DIAGNOSIS — R102 Pelvic and perineal pain: Secondary | ICD-10-CM | POA: Diagnosis not present

## 2015-08-14 DIAGNOSIS — O26891 Other specified pregnancy related conditions, first trimester: Secondary | ICD-10-CM | POA: Diagnosis not present

## 2015-08-14 LAB — URINALYSIS, ROUTINE W REFLEX MICROSCOPIC
BILIRUBIN URINE: NEGATIVE
GLUCOSE, UA: NEGATIVE mg/dL
Hgb urine dipstick: NEGATIVE
KETONES UR: 15 mg/dL — AB
LEUKOCYTES UA: NEGATIVE
Nitrite: NEGATIVE
PH: 6.5 (ref 5.0–8.0)
Protein, ur: NEGATIVE mg/dL
Specific Gravity, Urine: 1.022 (ref 1.005–1.030)

## 2015-08-14 LAB — CBC WITH DIFFERENTIAL/PLATELET
Basophils Absolute: 0 10*3/uL (ref 0.0–0.1)
Basophils Relative: 0 %
Eosinophils Absolute: 0.1 10*3/uL (ref 0.0–0.7)
Eosinophils Relative: 1 %
HEMATOCRIT: 37.5 % (ref 36.0–46.0)
HEMOGLOBIN: 13.5 g/dL (ref 12.0–15.0)
LYMPHS ABS: 2 10*3/uL (ref 0.7–4.0)
LYMPHS PCT: 22 %
MCH: 29.6 pg (ref 26.0–34.0)
MCHC: 36 g/dL (ref 30.0–36.0)
MCV: 82.2 fL (ref 78.0–100.0)
Monocytes Absolute: 1.2 10*3/uL — ABNORMAL HIGH (ref 0.1–1.0)
Monocytes Relative: 13 %
NEUTROS PCT: 64 %
Neutro Abs: 5.9 10*3/uL (ref 1.7–7.7)
Platelets: 314 10*3/uL (ref 150–400)
RBC: 4.56 MIL/uL (ref 3.87–5.11)
RDW: 11.8 % (ref 11.5–15.5)
WBC: 9.2 10*3/uL (ref 4.0–10.5)

## 2015-08-14 LAB — WET PREP, GENITAL
CLUE CELLS WET PREP: NONE SEEN
SPERM: NONE SEEN
TRICH WET PREP: NONE SEEN
YEAST WET PREP: NONE SEEN

## 2015-08-14 LAB — HCG, QUANTITATIVE, PREGNANCY: hCG, Beta Chain, Quant, S: 61339 m[IU]/mL — ABNORMAL HIGH (ref <5)

## 2015-08-14 LAB — BASIC METABOLIC PANEL
Anion gap: 9 (ref 5–15)
BUN: 10 mg/dL (ref 6–20)
CHLORIDE: 105 mmol/L (ref 101–111)
CO2: 20 mmol/L — AB (ref 22–32)
CREATININE: 0.55 mg/dL (ref 0.44–1.00)
Calcium: 9 mg/dL (ref 8.9–10.3)
GFR calc Af Amer: >60 mL/min (ref >60)
GFR calc non Af Amer: >60 mL/min (ref >60)
GLUCOSE: 103 mg/dL — AB (ref 65–99)
POTASSIUM: 3.3 mmol/L — AB (ref 3.5–5.1)
Sodium: 134 mmol/L — ABNORMAL LOW (ref 135–145)

## 2015-08-14 LAB — ABO/RH: ABO/RH(D): O POS

## 2015-08-14 LAB — GC/CHLAMYDIA PROBE AMP (~~LOC~~) NOT AT ARMC
CHLAMYDIA, DNA PROBE: NEGATIVE
Neisseria Gonorrhea: NEGATIVE

## 2015-08-14 LAB — PREGNANCY, URINE: Preg Test, Ur: POSITIVE — AB

## 2015-08-14 MED ORDER — METOCLOPRAMIDE HCL 5 MG/ML IJ SOLN
10.0000 mg | Freq: Once | INTRAMUSCULAR | Status: AC
  Administered 2015-08-14: 10 mg via INTRAVENOUS
  Filled 2015-08-14: qty 2

## 2015-08-14 MED ORDER — SODIUM CHLORIDE 0.9 % IV BOLUS (SEPSIS)
1000.0000 mL | Freq: Once | INTRAVENOUS | Status: AC
  Administered 2015-08-14: 1000 mL via INTRAVENOUS

## 2015-08-14 MED ORDER — FENTANYL CITRATE (PF) 100 MCG/2ML IJ SOLN
100.0000 ug | Freq: Once | INTRAMUSCULAR | Status: AC
  Administered 2015-08-14: 100 ug via INTRAVENOUS
  Filled 2015-08-14: qty 2

## 2015-08-14 NOTE — MAU Provider Note (Signed)
History     CSN: [id number]  Arrival date and time: 08/14/15 0034   None     Chief Complaint  Patient presents with  . Abdominal Pain   HPI Comments: Yvonne Gentry is a 25 y.o. G2P2002 at Unknown who [facility] She was seen there for LUQ pain in early pregnancy. Unable to do US at night and cannot confirm IUP with bedside US. She denies any VB.   Abdominal Pain This is a new problem. Episode onset: 2 days ago. The onset quality is gradual. The problem occurs constantly. The pain is located in the LUQ. The pain is severe. The quality of the pain is sharp. Pertinent negatives include no constipation, diarrhea, dysuria, fever, frequency, nausea or vomiting. Nothing aggravates the pain. The pain is relieved by nothing. She has tried nothing for the symptoms.      Past Medical History  Diagnosis Date  . Asthma   . Pollen allergies     Past Surgical History  Procedure Laterality Date  . No past surgeries      Family History  Problem Relation Age of Onset  . Hypertension Father   . Diabetes Brother     Social History  Substance Use Topics  . Smoking status: Never Smoker   . Smokeless tobacco: Never Used  . Alcohol Use: Yes     Comment: occ    Allergies: No Known Allergies  No prescriptions prior to admission    Review of Systems  Constitutional: Negative for fever and chills.  Gastrointestinal: Positive for abdominal pain. Negative for nausea, vomiting, diarrhea and constipation.  Genitourinary: Negative for dysuria, urgency and frequency.   Physical Exam   Blood pressure 114/77, pulse 72, temperature 98.9 F (37.2 C), temperature source Oral, resp. rate 18, height 5\' 5"  (1.651 m), weight 56.7 kg (125 lb), SpO2 99 %.  Physical Exam  Nursing note and vitals reviewed. Constitutional: She is oriented to person, place, and time. She appears well-developed and well-nourished. No distress.  HENT:  Head: Normocephalic.   Cardiovascular: Normal rate.   Respiratory: Effort normal.  GI: Soft. There is no tenderness. There is no rebound.  Neurological: She is alert and oriented to person, place, and time.  Skin: Skin is warm and dry.  Psychiatric: She has a normal mood and affect.    Results for orders placed or performed during the hospital encounter of 08/14/15 (from the past 24 hour(s))  Urinalysis, Routine w reflex microscopic (not at [facility])     Status: Abnormal   Collection Time: 08/14/15 12:50 AM  Result Value Ref Range   Color, Urine YELLOW YELLOW   APPearance CLEAR CLEAR   Specific Gravity, Urine 1.022 1.005 - 1.030   pH 6.5 5.0 - 8.0   Glucose, UA NEGATIVE NEGATIVE mg/dL   Hgb urine dipstick NEGATIVE NEGATIVE   Bilirubin Urine NEGATIVE NEGATIVE   Ketones, ur 15 (A) NEGATIVE mg/dL   Protein, ur NEGATIVE NEGATIVE mg/dL   Nitrite NEGATIVE NEGATIVE   Leukocytes, UA NEGATIVE NEGATIVE  Pregnancy, urine     Status: Abnormal   Collection Time: 08/14/15 12:50 AM  Result Value Ref Range   Preg Test, Ur POSITIVE (A) NEGATIVE  CBC with Differential/Platelet     Status: Abnormal   Collection Time: 08/14/15  1:05 AM  Result Value Ref Range   WBC 9.2 4.0 - 10.5 K/uL   RBC 4.56 3.87 - 5.11 MIL/uL   Hemoglobin 13.5 12.0 - 15.0 g/dL  HCT 37.5 36.0 - 46.0 %   MCV 82.2 78.0 - 100.0 fL   MCH 29.6 26.0 - 34.0 pg   MCHC 36.0 30.0 - 36.0 g/dL   RDW 40.9 81.1 - 91.4 %   Platelets 314 150 - 400 K/uL   Neutrophils Relative % 64 %   Neutro Abs 5.9 1.7 - 7.7 K/uL   Lymphocytes Relative 22 %   Lymphs Abs 2.0 0.7 - 4.0 K/uL   Monocytes Relative 13 %   Monocytes Absolute 1.2 (H) 0.1 - 1.0 K/uL   Eosinophils Relative 1 %   Eosinophils Absolute 0.1 0.0 - 0.7 K/uL   Basophils Relative 0 %   Basophils Absolute 0.0 0.0 - 0.1 K/uL  Basic metabolic panel     Status: Abnormal   Collection Time: 08/14/15  1:05 AM  Result Value Ref Range   Sodium 134 (L) 135 - 145 mmol/L   Potassium 3.3 (L) 3.5 - 5.1 mmol/L    Chloride 105 101 - 111 mmol/L   CO2 20 (L) 22 - 32 mmol/L   Glucose, Bld 103 (H) 65 - 99 mg/dL   BUN 10 6 - 20 mg/dL   Creatinine, Ser 7.82 0.44 - 1.00 mg/dL   Calcium 9.0 8.9 - 95.6 mg/dL   GFR calc non Af Amer >60 >60 mL/min   GFR calc Af Amer >60 >60 mL/min   Anion gap 9 5 - 15  hCG, quantitative, pregnancy     Status: Abnormal   Collection Time: 08/14/15  1:05 AM  Result Value Ref Range   hCG, Beta Chain, Quant, S 21308 (H) <5 mIU/mL  ABO/Rh     Status: None   Collection Time: 08/14/15  1:05 AM  Result Value Ref Range   ABO/RH(D) O POS    No rh immune globuloin      NOT A RH IMMUNE GLOBULIN CANDIDATE, PT RH POSITIVE Performed at [facility]   Wet prep, genital     Status: Abnormal   Collection Time: 08/14/15  1:20 AM  Result Value Ref Range   Yeast Wet Prep HPF POC NONE SEEN NONE SEEN   Trich, Wet Prep NONE SEEN NONE SEEN   Clue Cells Wet Prep HPF POC NONE SEEN NONE SEEN   WBC, Wet Prep HPF POC MANY (A) NONE SEEN   Sperm NONE SEEN   US Ob Comp Less 14 Wks  08/14/2015  CLINICAL DATA:  Acute onset of left upper quadrant abdominal pain. Initial encounter. EXAM: OBSTETRIC <14 WK [place] AND TRANSVAGINAL OB US TECHNIQUE: Both transabdominal and transvaginal ultrasound examinations were performed for complete evaluation of the gestation as well as the maternal uterus, adnexal regions, and pelvic cul-de-sac. Transvaginal technique was performed to assess early pregnancy. COMPARISON:  Pelvic ultrasound performed 10/26/2008 FINDINGS: Intrauterine gestational sac: Single; visualized and normal in shape. Yolk sac:  Yes Embryo:  Yes Cardiac Activity: Yes Heart Rate: 121  bpm CRL:  6.2  mm   6 w   3 d                  [place] EDC: 04/05/2016 Subchorionic hemorrhage:  None visualized. Maternal uterus/adnexae: The uterus is unremarkable in appearance. The ovaries are within normal limits. The right ovary measures 4.2 x 2.9 x 3.0 cm, while the left ovary measures 2.9 x 1.0 x 1.6 cm. No suspicious  adnexal masses are seen; there is no evidence for ovarian torsion. No free fluid is seen within the pelvic cul-de-sac. IMPRESSION: Single live intrauterine  pregnancy noted, with a crown-rump length of 6 mm, corresponding to a gestational age of [redacted] weeks 3 days. This reflects an estimated date of delivery of April 05, 2016. Electronically Signed   By: Roanna RaiderJeffery  Chang M.D.   On: 08/14/2015 04:38   [place] Ob Transvaginal  08/14/2015  CLINICAL DATA:  Acute onset of left upper quadrant abdominal pain. Initial encounter. EXAM: OBSTETRIC <14 WK US AND TRANSVAGINAL OB US TECHNIQUE: Both transabdominal and transvaginal ultrasound examinations were performed for complete evaluation of the gestation as well as the maternal uterus, adnexal regions, and pelvic cul-de-sac. Transvaginal technique was performed to assess early pregnancy. COMPARISON:  Pelvic ultrasound performed 10/26/2008 FINDINGS: Intrauterine gestational sac: Single; visualized and normal in shape. Yolk sac:  Yes Embryo:  Yes Cardiac Activity: Yes Heart Rate: 121  bpm CRL:  6.2  mm   6 w   3 d                  US EDC: 04/05/2016 Subchorionic hemorrhage:  None visualized. Maternal uterus/adnexae: The uterus is unremarkable in appearance. The ovaries are within normal limits. The right ovary measures 4.2 x 2.9 x 3.0 cm, while the left ovary measures 2.9 x 1.0 x 1.6 cm. No suspicious adnexal masses are seen; there is no evidence for ovarian torsion. No free fluid is seen within the pelvic cul-de-sac. IMPRESSION: Single live intrauterine pregnancy noted, with a crown-rump length of 6 mm, corresponding to a gestational age of [redacted] weeks 3 days. This reflects an estimated date of delivery of April 05, 2016. Electronically Signed   By: Roanna RaiderJeffery  Chang M.D.   On: 08/14/2015 04:38     MAU Course  Procedures  MDM   Assessment and Plan   1. Abdominal pain in pregnancy   2. Pelvic pain affecting pregnancy in first trimester, antepartum   3. Intrauterine pregnancy     DC home Comfort measures reviewed  1st Trimester precautions  RX: none  Return to MAU as needed FU with OB as planned  Follow-up Information    [facility] an [facility] as [facility] as [facility]   Contact information:   [address] [place] [id number] [phone]         Tawnya CrookHogan, Heather Donovan 08/14/2015, 4:30 AM

## 2015-08-14 NOTE — ED Provider Notes (Signed)
CSN: [id number]     Arrival date & time 08/14/15  0034 History   First MD Initiated Contact with Patient 08/14/15 0053     Chief Complaint  Patient presents with  . Abdominal Pain     (Consider location/radiation/quality/duration/timing/severity/associated sxs/prior Treatment) HPI  This is a 25 year old female who reports having a positive home pregnancy test. She is here with a two-day history of sharp left upper quadrant which has become severe. She has had associated nausea and vomiting. She denies dysuria, hematuria or vaginal bleeding or discharge. She has not had a fever. Her last menstrual period was in May but she is not sure what day it began.  Past Medical History  Diagnosis Date  . Asthma   . Pollen allergies    Past Surgical History  Procedure Laterality Date  . No past surgeries     Family History  Problem Relation Age of Onset  . Hypertension Father   . Diabetes Brother    Social History  Substance Use Topics  . Smoking status: Never Smoker   . Smokeless tobacco: Never Used  . Alcohol Use: Yes     Comment: occ   OB History    Gravida Para Term Preterm AB TAB SAB Ectopic Multiple Living   2 2 2       2      Review of Systems  All other systems reviewed and are negative.   Allergies  Review of patient's allergies indicates no known allergies.  Home Medications   Prior to Admission medications   Not on File   BP 118/88 mmHg  Pulse 101  Temp(Src) 98.9 F (37.2 C) (Oral)  Resp 18  Ht 5\' 5"  (1.651 m)  Wt 125 lb (56.7 kg)  BMI 20.80 kg/m2  SpO2 100%  LMP  (Within Months)   Physical Exam  General: Well-developed, well-nourished female in no acute distress; appearance consistent with age of record HENT: normocephalic; atraumatic Eyes: pupils equal, round and reactive to light; extraocular muscles intact Neck: supple Heart: regular rate and rhythm Lungs: clear to auscultation bilaterally Abdomen: soft; nondistended; left upper quadrant  tenderness; no masses or hepatosplenomegaly; bowel sounds present GU: No CVA tenderness; normal external genitalia; white vaginal discharge; no vaginal bleeding; no cervical motion tenderness; mild left adnexal tenderness; no IUP seen on bedside ultrasound Extremities: No deformity; full range of motion; pulses normal Neurologic: Awake, alert and oriented; motor function intact in all extremities and symmetric; no facial droop Skin: Warm and dry Psychiatric: Flat affect    ED Course  Procedures (including critical care time)   MDM  Nursing notes and vitals signs, including pulse oximetry, reviewed.  Summary of this visit's results, reviewed by myself:  Labs:  Results for orders placed or performed during the hospital encounter of 08/14/15 (from the past 24 hour(s))  Urinalysis, Routine w reflex microscopic (not at [facility])     Status: Abnormal   Collection Time: 08/14/15 12:50 AM  Result Value Ref Range   Color, Urine YELLOW YELLOW   APPearance CLEAR CLEAR   Specific Gravity, Urine 1.022 1.005 - 1.030   pH 6.5 5.0 - 8.0   Glucose, UA NEGATIVE NEGATIVE mg/dL   Hgb urine dipstick NEGATIVE NEGATIVE   Bilirubin Urine NEGATIVE NEGATIVE   Ketones, ur 15 (A) NEGATIVE mg/dL   Protein, ur NEGATIVE NEGATIVE mg/dL   Nitrite NEGATIVE NEGATIVE   Leukocytes, UA NEGATIVE NEGATIVE  Pregnancy, urine     Status: Abnormal   Collection Time: 08/14/15 12:50 AM  Result Value Ref Range   Preg Test, Ur POSITIVE (A) NEGATIVE  CBC with Differential/Platelet     Status: Abnormal   Collection Time: 08/14/15  1:05 AM  Result Value Ref Range   WBC 9.2 4.0 - 10.5 K/uL   RBC 4.56 3.87 - 5.11 MIL/uL   Hemoglobin 13.5 12.0 - 15.0 g/dL   HCT 86.537.5 78.436.0 - 69.646.0 %   MCV 82.2 78.0 - 100.0 fL   MCH 29.6 26.0 - 34.0 pg   MCHC 36.0 30.0 - 36.0 g/dL   RDW 29.511.8 28.411.5 - 13.215.5 %   Platelets 314 150 - 400 K/uL   Neutrophils Relative % 64 %   Neutro Abs 5.9 1.7 - 7.7 K/uL   Lymphocytes Relative 22 %   Lymphs Abs 2.0  0.7 - 4.0 K/uL   Monocytes Relative 13 %   Monocytes Absolute 1.2 (H) 0.1 - 1.0 K/uL   Eosinophils Relative 1 %   Eosinophils Absolute 0.1 0.0 - 0.7 K/uL   Basophils Relative 0 %   Basophils Absolute 0.0 0.0 - 0.1 K/uL  Basic metabolic panel     Status: Abnormal   Collection Time: 08/14/15  1:05 AM  Result Value Ref Range   Sodium 134 (L) 135 - 145 mmol/L   Potassium 3.3 (L) 3.5 - 5.1 mmol/L   Chloride 105 101 - 111 mmol/L   CO2 20 (L) 22 - 32 mmol/L   Glucose, Bld 103 (H) 65 - 99 mg/dL   BUN 10 6 - 20 mg/dL   Creatinine, Ser 4.400.55 0.44 - 1.00 mg/dL   Calcium 9.0 8.9 - 10.210.3 mg/dL   GFR calc non Af Amer >60 >60 mL/min   GFR calc Af Amer >60 >60 mL/min   Anion gap 9 5 - 15  hCG, quantitative, pregnancy     Status: Abnormal   Collection Time: 08/14/15  1:05 AM  Result Value Ref Range   hCG, Beta Chain, Quant, S [id number] (H) <5 mIU/mL  Wet prep, genital     Status: Abnormal   Collection Time: 08/14/15  1:20 AM  Result Value Ref Range   Yeast Wet Prep HPF POC NONE SEEN NONE SEEN   Trich, Wet Prep NONE SEEN NONE SEEN   Clue Cells Wet Prep HPF POC NONE SEEN NONE SEEN   WBC, Wet Prep HPF POC MANY (A) NONE SEEN   Sperm NONE SEEN    2:38 AM Patient's pain has significantly improved. Her abdomen is soft and nontender.[facility] emergent ultrasound to evaluate for ectopic pregnancy. D/W Thressa ShellerHeather Hogan, CNM.      Paula LibraJohn Karthika Glasper, MD 08/14/15 [id number]

## 2015-08-14 NOTE — Discharge Instructions (Signed)
First Trimester of Pregnancy The first trimester of pregnancy is from week 1 until the end of week 12 (months 1 through 3). A week after a sperm fertilizes an egg, the egg will implant on the wall of the uterus. This embryo will begin to develop into a baby. Genes from you and your partner are forming the baby. The female genes determine whether the baby is a boy or a girl. At 6-8 weeks, the eyes and face are formed, and the heartbeat can be seen on ultrasound. At the end of 12 weeks, all the baby's organs are formed.  Now that you are pregnant, you will want to do everything you can to have a healthy baby. Two of the most important things are to get good prenatal care and to follow your health care provider's instructions. Prenatal care is all the medical care you receive before the baby's birth. This care will help prevent, find, and treat any problems during the pregnancy and childbirth. BODY CHANGES Your body goes through many changes during pregnancy. The changes vary from woman to woman.   You may gain or lose a couple of pounds at first.  You may feel sick to your stomach (nauseous) and throw up (vomit). If the vomiting is uncontrollable, call your health care provider.  You may tire easily.  You may develop headaches that can be relieved by medicines approved by your health care provider.  You may urinate more often. Painful urination may mean you have a bladder infection.  You may develop heartburn as a result of your pregnancy.  You may develop constipation because certain hormones are causing the muscles that push waste through your intestines to slow down.  You may develop hemorrhoids or swollen, bulging veins (varicose veins).  Your breasts may begin to grow larger and become tender. Your nipples may stick out more, and the tissue that surrounds them (areola) may become darker.  Your gums may bleed and may be sensitive to brushing and flossing.  Dark spots or blotches (chloasma,  mask of pregnancy) may develop on your face. This will likely fade after the baby is born.  Your menstrual periods will stop.  You may have a loss of appetite.  You may develop cravings for certain kinds of food.  You may have changes in your emotions from day to day, such as being excited to be pregnant or being concerned that something may go wrong with the pregnancy and baby.  You may have more vivid and strange dreams.  You may have changes in your hair. These can include thickening of your hair, rapid growth, and changes in texture. Some women also have hair loss during or after pregnancy, or hair that feels dry or thin. Your hair will most likely return to normal after your baby is born. WHAT TO EXPECT AT YOUR PRENATAL VISITS During a routine prenatal visit:  You will be weighed to make sure you and the baby are growing normally.  Your blood pressure will be taken.  Your abdomen will be measured to track your baby's growth.  The fetal heartbeat will be listened to starting around week 10 or 12 of your pregnancy.  Test results from any previous visits will be discussed. Your health care provider may ask you:  How you are feeling.  If you are feeling the baby move.  If you have had any abnormal symptoms, such as leaking fluid, bleeding, severe headaches, or abdominal cramping.  If you are using any tobacco products,   including cigarettes, chewing tobacco, and electronic cigarettes.  If you have any questions. Other tests that may be performed during your first trimester include:  Blood tests to find your blood type and to check for the presence of any previous infections. They will also be used to check for low iron levels (anemia) and Rh antibodies. Later in the pregnancy, blood tests for diabetes will be done along with other tests if problems develop.  Urine tests to check for infections, diabetes, or protein in the urine.  An ultrasound to confirm the proper growth  and development of the baby.  An amniocentesis to check for possible genetic problems.  Fetal screens for spina bifida and Down syndrome.  You may need other tests to make sure you and the baby are doing well.  HIV (human immunodeficiency virus) testing. Routine prenatal testing includes screening for HIV, unless you choose not to have this test. HOME CARE INSTRUCTIONS  Medicines  Follow your health care provider's instructions regarding medicine use. Specific medicines may be either safe or unsafe to take during pregnancy.  Take your prenatal vitamins as directed.  If you develop constipation, try taking a stool softener if your health care provider approves. Diet  Eat regular, well-balanced meals. Choose a variety of foods, such as meat or vegetable-based protein, fish, milk and low-fat dairy products, vegetables, fruits, and whole grain breads and cereals. Your health care provider will help you determine the amount of weight gain that is right for you.  Avoid raw meat and uncooked cheese. These carry germs that can cause birth defects in the baby.  Eating four or five small meals rather than three large meals a day may help relieve nausea and vomiting. If you start to feel nauseous, eating a few soda crackers can be helpful. Drinking liquids between meals instead of during meals also seems to help nausea and vomiting.  If you develop constipation, eat more high-fiber foods, such as fresh vegetables or fruit and whole grains. Drink enough fluids to keep your urine clear or pale yellow. Activity and Exercise  Exercise only as directed by your health care provider. Exercising will help you:  Control your weight.  Stay in shape.  Be prepared for labor and delivery.  Experiencing pain or cramping in the lower abdomen or low back is a good sign that you should stop exercising. Check with your health care provider before continuing normal exercises.  Try to avoid standing for long  periods of time. Move your legs often if you must stand in one place for a long time.  Avoid heavy lifting.  Wear low-heeled shoes, and practice good posture.  You may continue to have sex unless your health care provider directs you otherwise. Relief of Pain or Discomfort  Wear a good support bra for breast tenderness.   Take warm sitz baths to soothe any pain or discomfort caused by hemorrhoids. Use hemorrhoid cream if your health care provider approves.   Rest with your legs elevated if you have leg cramps or low back pain.  If you develop varicose veins in your legs, wear support hose. Elevate your feet for 15 minutes, 3-4 times a day. Limit salt in your diet. Prenatal Care  Schedule your prenatal visits by the twelfth week of pregnancy. They are usually scheduled monthly at first, then more often in the last 2 months before delivery.  Write down your questions. Take them to your prenatal visits.  Keep all your prenatal visits as directed by your   health care provider. Safety  Wear your seat belt at all times when driving.  Make a list of emergency phone numbers, including numbers for family, friends, the hospital, and police and fire departments. General Tips  Ask your health care provider for a referral to a local prenatal education class. Begin classes no later than at the beginning of month 6 of your pregnancy.  Ask for help if you have counseling or nutritional needs during pregnancy. Your health care provider can offer advice or refer you to specialists for help with various needs.  Do not use hot tubs, steam rooms, or saunas.  Do not douche or use tampons or scented sanitary pads.  Do not cross your legs for long periods of time.  Avoid cat litter boxes and soil used by cats. These carry germs that can cause birth defects in the baby and possibly loss of the fetus by miscarriage or stillbirth.  Avoid all smoking, herbs, alcohol, and medicines not prescribed by  your health care provider. Chemicals in these affect the formation and growth of the baby.  Do not use any tobacco products, including cigarettes, chewing tobacco, and electronic cigarettes. If you need help quitting, ask your health care provider. You may receive counseling support and other resources to help you quit.  Schedule a dentist appointment. At home, brush your teeth with a soft toothbrush and be gentle when you floss. SEEK MEDICAL CARE IF:   You have dizziness.  You have mild pelvic cramps, pelvic pressure, or nagging pain in the abdominal area.  You have persistent nausea, vomiting, or diarrhea.  You have a bad smelling vaginal discharge.  You have pain with urination.  You notice increased swelling in your face, hands, legs, or ankles. SEEK IMMEDIATE MEDICAL CARE IF:   You have a fever.  You are leaking fluid from your vagina.  You have spotting or bleeding from your vagina.  You have severe abdominal cramping or pain.  You have rapid weight gain or loss.  You vomit blood or material that looks like coffee grounds.  You are exposed to German measles and have never had them.  You are exposed to fifth disease or chickenpox.  You develop a severe headache.  You have shortness of breath.  You have any kind of trauma, such as from a fall or a car accident.   This information is not intended to replace advice given to you by your health care provider. Make sure you discuss any questions you have with your health care provider.   Document Released: 01/19/2001 Document Revised: 02/15/2014 Document Reviewed: 12/05/2012 Elsevier Interactive Patient Education 2016 [employer]  

## 2015-08-14 NOTE — ED Notes (Signed)
abd pain x 2 days, w n/v,  Denies urinary sx,  No dc

## 2015-08-15 LAB — HIV ANTIBODY (ROUTINE TESTING W REFLEX): HIV SCREEN 4TH GENERATION: NONREACTIVE

## 2015-09-05 ENCOUNTER — Emergency Department (HOSPITAL_BASED_OUTPATIENT_CLINIC_OR_DEPARTMENT_OTHER)
Admission: EM | Admit: 2015-09-05 | Discharge: 2015-09-05 | Disposition: A | Discharge status: Home / Self Care | Payer: Medicaid Other | Attending: Emergency Medicine | Admitting: Emergency Medicine

## 2015-09-05 ENCOUNTER — Encounter (HOSPITAL_BASED_OUTPATIENT_CLINIC_OR_DEPARTMENT_OTHER): Payer: Self-pay | Admitting: Emergency Medicine

## 2015-09-05 DIAGNOSIS — J45909 Unspecified asthma, uncomplicated: Secondary | ICD-10-CM | POA: Insufficient documentation

## 2015-09-05 DIAGNOSIS — Z79899 Other long term (current) drug therapy: Secondary | ICD-10-CM | POA: Diagnosis not present

## 2015-09-05 DIAGNOSIS — O26891 Other specified pregnancy related conditions, first trimester: Secondary | ICD-10-CM | POA: Diagnosis not present

## 2015-09-05 DIAGNOSIS — Z3A09 9 weeks gestation of pregnancy: Secondary | ICD-10-CM | POA: Insufficient documentation

## 2015-09-05 DIAGNOSIS — R109 Unspecified abdominal pain: Secondary | ICD-10-CM | POA: Insufficient documentation

## 2015-09-05 DIAGNOSIS — O219 Vomiting of pregnancy, unspecified: Secondary | ICD-10-CM | POA: Insufficient documentation

## 2015-09-05 LAB — CBC WITH DIFFERENTIAL/PLATELET
BASOS ABS: 0 10*3/uL (ref 0.0–0.1)
BASOS PCT: 0 %
Eosinophils Absolute: 0 10*3/uL (ref 0.0–0.7)
Eosinophils Relative: 0 %
HEMATOCRIT: 37.6 % (ref 36.0–46.0)
HEMOGLOBIN: 13.2 g/dL (ref 12.0–15.0)
LYMPHS ABS: 0.7 10*3/uL (ref 0.7–4.0)
Lymphocytes Relative: 7 %
MCH: 29.3 pg (ref 26.0–34.0)
MCHC: 35.1 g/dL (ref 30.0–36.0)
MCV: 83.6 fL (ref 78.0–100.0)
Monocytes Absolute: 0.9 10*3/uL (ref 0.1–1.0)
Monocytes Relative: 9 %
NEUTROS ABS: 8.2 10*3/uL — AB (ref 1.7–7.7)
NEUTROS PCT: 84 %
Platelets: 262 10*3/uL (ref 150–400)
RBC: 4.5 MIL/uL (ref 3.87–5.11)
RDW: 12.2 % (ref 11.5–15.5)
WBC: 9.8 10*3/uL (ref 4.0–10.5)

## 2015-09-05 LAB — URINALYSIS, ROUTINE W REFLEX MICROSCOPIC
Bilirubin Urine: NEGATIVE
GLUCOSE, UA: NEGATIVE mg/dL
HGB URINE DIPSTICK: NEGATIVE
KETONES UR: 15 mg/dL — AB
Leukocytes, UA: NEGATIVE
Nitrite: NEGATIVE
PROTEIN: NEGATIVE mg/dL
Specific Gravity, Urine: 1.023 (ref 1.005–1.030)
pH: 6.5 (ref 5.0–8.0)

## 2015-09-05 LAB — COMPREHENSIVE METABOLIC PANEL
ALBUMIN: 3.7 g/dL (ref 3.5–5.0)
ALK PHOS: 44 U/L (ref 38–126)
ALT: 11 U/L — ABNORMAL LOW (ref 14–54)
AST: 15 U/L (ref 15–41)
Anion gap: 10 (ref 5–15)
BILIRUBIN TOTAL: 0.8 mg/dL (ref 0.3–1.2)
BUN: 12 mg/dL (ref 6–20)
CALCIUM: 8.4 mg/dL — AB (ref 8.9–10.3)
CO2: 21 mmol/L — ABNORMAL LOW (ref 22–32)
Chloride: 102 mmol/L (ref 101–111)
Creatinine, Ser: 0.49 mg/dL (ref 0.44–1.00)
GFR calc Af Amer: >60 mL/min (ref >60)
GFR calc non Af Amer: >60 mL/min (ref >60)
GLUCOSE: 82 mg/dL (ref 65–99)
Potassium: 3.5 mmol/L (ref 3.5–5.1)
Sodium: 133 mmol/L — ABNORMAL LOW (ref 135–145)
TOTAL PROTEIN: 7.3 g/dL (ref 6.5–8.1)

## 2015-09-05 MED ORDER — METOCLOPRAMIDE HCL 10 MG PO TABS: 10.0000 mg | ORAL_TABLET | Freq: Three times a day (TID) | ORAL | 0 refills | Status: DC | PRN

## 2015-09-05 MED ORDER — METOCLOPRAMIDE HCL 5 MG/ML IJ SOLN
10.0000 mg | Freq: Once | INTRAMUSCULAR | Status: AC
  Administered 2015-09-05: 10 mg via INTRAVENOUS
  Filled 2015-09-05: qty 2

## 2015-09-05 MED ORDER — SODIUM CHLORIDE 0.9 % IV BOLUS (SEPSIS)
1000.0000 mL | Freq: Once | INTRAVENOUS | Status: AC
  Administered 2015-09-05: 1000 mL via INTRAVENOUS

## 2015-09-05 NOTE — Discharge Instructions (Signed)
Please read and follow all provided instructions.  Your diagnoses today include:  1. Nausea and vomiting in pregnancy     Tests performed today include: Blood counts and electrolytes Blood tests to check kidney function Urine test to look for infection  Vital signs. See below for your results today.   Medications prescribed:  Reglan - for nausea and vomiting  Take any prescribed medications only as directed.  Home care instructions:  Follow any educational materials contained in this packet. You may use doxylamine, available as an over-the-counter sleeping pills (eg, Unisom Sleep Tabs): One-half of the 25 mg over-the-counter tablet twice a day for vomiting. In addition, pyridoxine 25 mg (Vitamin B6), also available over-the-counter, is taken three or four times per day for vomiting.    Follow-up instructions: Please follow-up with your primary care provider in the next 3 days for further evaluation of your symptoms.    Return instructions:  SEEK IMMEDIATE MEDICAL ATTENTION IF: The pain does not go away or becomes severe  A temperature above 101F develops  Repeated vomiting occurs (multiple episodes)  The pain becomes localized to portions of the abdomen. The right side could possibly be appendicitis. In an adult, the left lower portion of the abdomen could be colitis or diverticulitis.  Blood is being passed in stools or vomit (bright red or black tarry stools)  You develop chest pain, difficulty breathing, dizziness or fainting, or become confused, poorly responsive, or inconsolable (young children) If you have any other emergent concerns regarding your health  Additional Information: Abdominal (belly) pain can be caused by many things. Your caregiver performed an examination and possibly ordered blood/urine tests and imaging (CT scan, x-rays, ultrasound). Many cases can be observed and treated at home after initial evaluation in the emergency department. Even though you are  being discharged home, abdominal pain can be unpredictable. Therefore, you need a repeated exam if your pain does not resolve, returns, or worsens. Most patients with abdominal pain don't have to be admitted to the hospital or have surgery, but serious problems like appendicitis and gallbladder attacks can start out as nonspecific pain. Many abdominal conditions cannot be diagnosed in one visit, so follow-up evaluations are very important.  Your vital signs today were: BP 115/70 (BP Location: Right Arm)    Pulse 99    Temp 98.4 F (36.9 C) (Oral)    Resp 16    Ht 5\' 5"  (1.651 m)    Wt 59 kg    LMP  (Within Months)    SpO2 100%    BMI 21.63 kg/m  If your blood pressure (bp) was elevated above 135/85 this visit, please have this repeated by your doctor within one month. --------------

## 2015-09-05 NOTE — ED Provider Notes (Signed)
[facility]Y [facility] Provider Note   CSN: [id number] Arrival date & time: 09/05/15  1544  By signing my name below, I, Alyssa Grove, attest that this documentation has been prepared under the direction and in the presence of Renne Crigler, PA-C. Electronically Signed: Alyssa Grove, ED Scribe. 09/05/15. 5:08 PM.  First Provider Contact:   First MD Initiated Contact with Patient 09/05/15 1601     History   Chief Complaint Chief Complaint  Patient presents with  . Emesis    The history is provided by the patient. No language interpreter was used.    HPI Comments: Yvonne Gentry is a 25 y.o. female who presents to the Emergency Department complaining of gradual onset, episodic vomiting onset 3 days. Pt reports she has been throwing up since her last appointment on 08/14/2015, but that it has "become unbearable". Pt reports she has been feeling dizzy and nauseous. Pt reports associated chills and feeling like she is "burning up". She also reports associated 6/10 left sided abdominal pain. Pt states she went to [facility] on 08/15/2015  for LUQ abdominal pain where she was diagnosed with gallstones/sludge and had a slightly dilated L ureter without any identified abnormality. Pt reports when she eats she gets a gagging feeling for a couple minutes before vomiting and that she is unable to keep food down. Pt reports she has been pregnant before and had problems with vomiting with her past 2 pregnancies. Pt denies chest pain or shortness of breath, dysuria or hematuria.    Past Medical History:  Diagnosis Date  . Asthma   . Pollen allergies     There are no active problems to display for this patient.   Past Surgical History:  Procedure Laterality Date  . NO PAST SURGERIES      OB History    Gravida Para Term Preterm AB Living   SAB TAB Ectopic Multiple Live Births                   Home Medications    Prior to Admission medications   Medication  Sig Start Date End Date Taking? Authorizing Provider  Prenatal Multivit-Min-Fe-FA (PRENATAL VITAMINS PO) Take by mouth.   Yes Historical Provider, MD    Family History Family History  Problem Relation Age of Onset  . Hypertension Father   . Diabetes Brother     Social History Social History  Substance Use Topics  . Smoking status: Never Smoker  . Smokeless tobacco: Never Used  . Alcohol use Yes     Comment: not since being pregnant     Allergies   Review of patient's allergies indicates no known allergies.   Review of Systems Review of Systems  Constitutional: Positive for chills. Negative for fever.  HENT: Negative for rhinorrhea and sore throat.   Eyes: Negative for redness.  Respiratory: Negative for cough and shortness of breath.   Cardiovascular: Negative for chest pain.  Gastrointestinal: Positive for abdominal pain, nausea and vomiting. Negative for diarrhea.  Genitourinary: Negative for difficulty urinating, dysuria and hematuria.  Musculoskeletal: Negative for myalgias.  Skin: Negative for rash.  Neurological: Negative for headaches.     Physical Exam Updated Vital Signs BP 113/81 (BP Location: Left Arm)   Pulse 109   Temp 98.4 F (36.9 C) (Oral)   Resp 18   Ht  (1.651 m)   Wt 130 lb (59 kg)   LMP  (Within  Months)   SpO2 99%   BMI 21.63 kg/m   Physical Exam  Constitutional: She appears well-developed and well-nourished.  HENT:  Head: Normocephalic and atraumatic.  Moist mucous membranes.  Eyes: Conjunctivae are normal. Right eye exhibits no discharge. Left eye exhibits no discharge.  Neck: Normal range of motion. Neck supple.  Cardiovascular: Normal rate, regular rhythm and normal heart sounds.   Pulmonary/Chest: Effort normal and breath sounds normal.  Abdominal: Soft. There is no tenderness.  Neurological: She is alert.  Skin: Skin is warm and dry.  Psychiatric: She has a normal mood and affect.  Nursing note and vitals  reviewed.    ED Treatments / Results  DIAGNOSTIC STUDIES: Oxygen Saturation is 99% on RA, normal by my interpretation.    COORDINATION OF CARE: 4:09 PM Discussed treatment plan with pt at bedside which includes Reglan Injection, CBC with Differential, Comprehensive Metabolic Panel and UA and pt agreed to plan.  Labs (all labs ordered are listed, but only abnormal results are displayed) Labs Reviewed  CBC WITH DIFFERENTIAL/PLATELET - Abnormal; Notable for the following:       Result Value   Neutro Abs 8.2 (*)    All other components within normal limits  COMPREHENSIVE METABOLIC PANEL - Abnormal; Notable for the following:    Sodium 133 (*)    CO2 21 (*)    Calcium 8.4 (*)    ALT 11 (*)    All other components within normal limits  URINALYSIS, ROUTINE W REFLEX MICROSCOPIC (NOT AT [facility]) - Abnormal; Notable for the following:    Ketones, ur 15 (*)    All other components within normal limits    EKG  EKG Interpretation None       Radiology No results found.  Procedures Procedures (including critical care time)  Medications Ordered in ED Medications  sodium chloride 0.9 % bolus 1,000 mL (1,000 mLs Intravenous New Bag/Given 09/05/15 1621)  metoCLOPramide (REGLAN) injection 10 mg (10 mg Intravenous Given 09/05/15 1624)     Initial Impression / Assessment and Plan / ED Course  I have reviewed the triage vital signs and the nursing notes.  Pertinent labs & imaging results that were available during my care of the patient were reviewed by me and considered in my medical decision making (see chart for details).  Clinical Course  Comment By Time  UA with slight ketones, CNC with normal WBC. Renne Crigler, PA-C 07/28 1635  CMP reviewed.  Renne Crigler, PA-C 07/28 1654  Pt updated. Nausea improved, will PO challenge.  Renne Crigler, PA-C 07/28 1751   Vital signs reviewed and are as follows: BP 113/81 (BP Location: Left Arm)   Pulse 109   Temp 98.4 F (36.9 C) (Oral)    Resp 18   Ht 5\' 5"  (1.651 m)   Wt 130 lb (59 kg)   LMP  (Within Months)   SpO2 99%   BMI 21.63 kg/m   7:05 PM Passed PO challenge. DC to home with Reglan. Patient urged to keep her OB/GYN appointment next week. Return to emergency department with abdominal pain, vaginal bleeding or discharge, worsening pain, persistent vomiting, or other concerns.  Final Clinical Impressions(s) / ED Diagnoses   Final diagnoses:  Nausea and vomiting in pregnancy    New Prescriptions New Prescriptions   METOCLOPRAMIDE (REGLAN) 10 MG TABLET    Take 1 tablet (10 mg total) by mouth every 8 (eight) hours as needed for nausea.   Patient with nausea and vomiting in setting of  pregnancy. IUP confirmed on previous visit. Patient does have some left lower quadrant abdominal pain which has been present for several weeks. She is using Tylenol for this at home. No tenderness on exam today. Symptoms are controlled. Patient was hydrated, appears well. Will discharge to home as above.  I personally performed the services described in this documentation, which was scribed in my presence. The recorded information has been reviewed and is accurate.     Renne Crigler, PA-C 09/05/15 1907    Jacalyn Lefevre, MD 09/05/15 [phone]

## 2015-09-05 NOTE — ED Notes (Signed)
Pt reports ongoing N/V since finding out she is pregnant. Pt states she is approximatley [redacted] weeks pregnant and is unable to get into the doctor until next week. Pt also reports LLQ pain.

## 2015-09-05 NOTE — ED Notes (Signed)
Pt given ginger ale.

## 2015-09-05 NOTE — ED Triage Notes (Signed)
Pt reports dizziness and vomiting x3 days.  Is [redacted] wks pregnant.  Has OBGYN appt 7/30. Sts she is unable to keep anything down.

## 2015-09-18 LAB — OB RESULTS CONSOLE RPR: RPR: NONREACTIVE

## 2015-09-18 LAB — OB RESULTS CONSOLE RUBELLA ANTIBODY, IGM: Rubella: IMMUNE

## 2015-09-18 LAB — OB RESULTS CONSOLE ANTIBODY SCREEN: ANTIBODY SCREEN: NEGATIVE

## 2015-09-18 LAB — OB RESULTS CONSOLE HEPATITIS B SURFACE ANTIGEN: HEP B S AG: NEGATIVE

## 2015-11-26 ENCOUNTER — Inpatient Hospital Stay (HOSPITAL_COMMUNITY): Payer: Medicaid Other

## 2015-11-26 ENCOUNTER — Encounter (HOSPITAL_COMMUNITY): Payer: Self-pay | Admitting: *Deleted

## 2015-11-26 ENCOUNTER — Inpatient Hospital Stay (HOSPITAL_COMMUNITY)
Admission: AD | Admit: 2015-11-26 | Discharge: 2015-11-26 | Disposition: A | Discharge status: Home / Self Care | Payer: Medicaid Other | Source: Ambulatory Visit | Attending: Obstetrics and Gynecology | Admitting: Obstetrics and Gynecology

## 2015-11-26 DIAGNOSIS — O26612 Liver and biliary tract disorders in pregnancy, second trimester: Secondary | ICD-10-CM | POA: Insufficient documentation

## 2015-11-26 DIAGNOSIS — Z3A21 21 weeks gestation of pregnancy: Secondary | ICD-10-CM | POA: Diagnosis not present

## 2015-11-26 DIAGNOSIS — R1010 Upper abdominal pain, unspecified: Secondary | ICD-10-CM

## 2015-11-26 DIAGNOSIS — K8020 Calculus of gallbladder without cholecystitis without obstruction: Secondary | ICD-10-CM | POA: Diagnosis not present

## 2015-11-26 DIAGNOSIS — R1011 Right upper quadrant pain: Secondary | ICD-10-CM | POA: Diagnosis present

## 2015-11-26 HISTORY — DX: Calculus of gallbladder without cholecystitis without obstruction: K80.20

## 2015-11-26 LAB — URINALYSIS, ROUTINE W REFLEX MICROSCOPIC
Bilirubin Urine: NEGATIVE
Glucose, UA: NEGATIVE mg/dL
HGB URINE DIPSTICK: NEGATIVE
Ketones, ur: NEGATIVE mg/dL
LEUKOCYTES UA: NEGATIVE
NITRITE: NEGATIVE
PROTEIN: NEGATIVE mg/dL
Specific Gravity, Urine: 1.015 (ref 1.005–1.030)
pH: 6.5 (ref 5.0–8.0)

## 2015-11-26 LAB — CBC
HEMATOCRIT: 32 % — AB (ref 36.0–46.0)
Hemoglobin: 11.3 g/dL — ABNORMAL LOW (ref 12.0–15.0)
MCH: 30.2 pg (ref 26.0–34.0)
MCHC: 35.3 g/dL (ref 30.0–36.0)
MCV: 85.6 fL (ref 78.0–100.0)
Platelets: 218 10*3/uL (ref 150–400)
RBC: 3.74 MIL/uL — AB (ref 3.87–5.11)
RDW: 13.2 % (ref 11.5–15.5)
WBC: 9.5 10*3/uL (ref 4.0–10.5)

## 2015-11-26 LAB — LIPASE, BLOOD: LIPASE: 21 U/L (ref 11–51)

## 2015-11-26 LAB — COMPREHENSIVE METABOLIC PANEL
ALT: 10 U/L — ABNORMAL LOW (ref 14–54)
AST: 14 U/L — AB (ref 15–41)
Albumin: 3.2 g/dL — ABNORMAL LOW (ref 3.5–5.0)
Alkaline Phosphatase: 47 U/L (ref 38–126)
Anion gap: 6 (ref 5–15)
BUN: 7 mg/dL (ref 6–20)
CHLORIDE: 103 mmol/L (ref 101–111)
CO2: 23 mmol/L (ref 22–32)
Calcium: 8.2 mg/dL — ABNORMAL LOW (ref 8.9–10.3)
Creatinine, Ser: 0.39 mg/dL — ABNORMAL LOW (ref 0.44–1.00)
GFR calc Af Amer: >60 mL/min (ref >60)
Glucose, Bld: 80 mg/dL (ref 65–99)
POTASSIUM: 3.5 mmol/L (ref 3.5–5.1)
SODIUM: 132 mmol/L — AB (ref 135–145)
Total Bilirubin: 0.4 mg/dL (ref 0.3–1.2)
Total Protein: 6.6 g/dL (ref 6.5–8.1)

## 2015-11-26 MED ORDER — OXYCODONE-ACETAMINOPHEN 5-325 MG PO TABS: 1.0000 | ORAL_TABLET | ORAL | 0 refills | Status: DC | PRN

## 2015-11-26 MED ORDER — HYDROMORPHONE HCL 1 MG/ML IJ SOLN
1.0000 mg | Freq: Once | INTRAMUSCULAR | Status: AC
Start: 2015-11-26 — End: 2015-11-26
  Administered 2015-11-26: 1 mg via INTRAVENOUS
  Filled 2015-11-26: qty 1

## 2015-11-26 MED ORDER — LACTATED RINGERS IV BOLUS (SEPSIS)
1000.0000 mL | Freq: Once | INTRAVENOUS | Status: AC
  Administered 2015-11-26: 1000 mL via INTRAVENOUS

## 2015-11-26 MED ORDER — IBUPROFEN 600 MG PO TABS: 600.0000 mg | ORAL_TABLET | Freq: Four times a day (QID) | ORAL | 0 refills | Status: DC | PRN

## 2015-11-26 MED ORDER — FAMOTIDINE IN NACL 20-0.9 MG/50ML-% IV SOLN
20.0000 mg | Freq: Once | INTRAVENOUS | Status: AC
  Administered 2015-11-26: 20 mg via INTRAVENOUS
  Filled 2015-11-26: qty 50

## 2015-11-26 NOTE — Discharge Instructions (Signed)

## 2015-11-26 NOTE — MAU Note (Signed)
Arrived via EMS with C/O pain in R side and around to R mid abdomen. Constant, but has intermittent sharp pains. Started around 0900. States she vomited a couple of times. No bleeding or unusual vaginal D/C. Known gallstones.

## 2015-11-26 NOTE — MAU Provider Note (Signed)
History     CSN: [id number]  Arrival date and time: 11/26/15 1523   None     Chief Complaint  Patient presents with  . Abdominal Pain   HPI   [name] is 25 y.o. female [id number] @ 78w2dhere with right upper quadrant abdominal pain. She is having pain spells that have been going on all day and states that the pain spells are unbearable. The pain started this morning around 0900, however it has been off and on since June. She was diagnosed with gall stones in July. Since then it flairs up and it is "unbearable". She has been avoiding spicy, greasy or fatty foods. She was told at the beginning of her pregnancy that she needed to have her gall bladder removed.   OB History    Gravida Para Term Preterm AB Living   3 2 2     2    SAB TAB Ectopic Multiple Live Births           1      Past Medical History:  Diagnosis Date  . Asthma   . Gallstones   . Pollen allergies     Past Surgical History:  Procedure Laterality Date  . NO PAST SURGERIES      Family History  Problem Relation Age of Onset  . Hypertension Father   . Diabetes Brother     Social History  Substance Use Topics  . Smoking status: Never Smoker  . Smokeless tobacco: Never Used  . Alcohol use Yes     Comment: not since being pregnant    Allergies: No Known Allergies  Prescriptions Prior to Admission  Medication Sig Dispense Refill Last Dose  . metoCLOPramide (REGLAN) 10 MG tablet Take 1 tablet (10 mg total) by mouth every 8 (eight) hours as needed for nausea. 30 tablet 0   . Prenatal Multivit-Min-Fe-FA (PRENATAL VITAMINS PO) Take by mouth.      Results for orders placed or performed during the hospital encounter of 11/26/15 (from the past 48 hour(s))  [facility]inal[facility], [facility]     Status: None   Collection Time: 11/26/15  3:28 PM  Result Value Ref Range   Color, Urine YELLOW YELLOW   APPearance CLEAR CLEAR   Specific Gravity, Urine 1.015 1.005 - 1.030   pH  6.5 5.0 - 8.0   Glucose, UA NEGATIVE NEGATIVE mg/dL   Hgb urine dipstick NEGATIVE NEGATIVE   Bilirubin Urine NEGATIVE NEGATIVE   Ketones, ur NEGATIVE NEGATIVE mg/dL   Protein, ur NEGATIVE NEGATIVE mg/dL   Nitrite NEGATIVE NEGATIVE   Leukocytes, UA NEGATIVE NEGATIVE    Comment: MICROSCOPIC NOT DONE ON URINES WITH NEGATIVE PROTEIN, BLOOD, LEUKOCYTES, NITRITE, OR GLUCOSE <1000 mg/dL.  Lipase, blood     Status: None   Collection Time: 11/26/15  5:01 PM  Result Value Ref Range   Lipase 21 11 - 51 U/L  Comprehensive metabolic panel     Status: Abnormal   Collection Time: 11/26/15  5:01 PM  Result Value Ref Range   Sodium 132 (L) 135 - 145 mmol/L   Potassium 3.5 3.5 - 5.1 mmol/L   Chloride 103 101 - 111 mmol/L   CO2 23 22 - 32 mmol/L   Glucose, Bld 80 65 - 99 mg/dL   BUN 7 6 - 20 mg/dL   Creatinine, Ser 0.39 (L) 0.44 - 1.00 mg/dL   Calcium 8.2 (L) 8.9 - 10.3 mg/dL   Total Protein 6.6 6.5 - 8.1  g/dL   Albumin 3.2 (L) 3.5 - 5.0 g/dL   AST 14 (L) 15 - 41 U/L   ALT 10 (L) 14 - 54 U/L   Alkaline Phosphatase 47 38 - 126 U/L   Total Bilirubin 0.4 0.3 - 1.2 mg/dL   GFR calc non Af Amer >60 >60 mL/min   GFR calc Af Amer >60 >60 mL/min    Comment: (NOTE) The eGFR has been calculated using the CKD EPI equation. This calculation has not been validated in all clinical situations. eGFR's persistently <60 mL/min signify possible Chronic Kidney Disease.    Anion gap 6 5 - 15  CBC     Status: Abnormal   Collection Time: 11/26/15  5:01 PM  Result Value Ref Range   WBC 9.5 4.0 - 10.5 K/uL   RBC 3.74 (L) 3.87 - 5.11 MIL/uL   Hemoglobin 11.3 (L) 12.0 - 15.0 g/dL   HCT 32.0 (L) 36.0 - 46.0 %   MCV 85.6 78.0 - 100.0 fL   MCH 30.2 26.0 - 34.0 pg   MCHC 35.3 30.0 - 36.0 g/dL   RDW 13.2 11.5 - 15.5 %   Platelets 218 150 - 400 K/uL   US Abdomen Limited Ruq  Result Date: 11/26/2015 CLINICAL DATA:  Right-sided abdominal pain EXAM: US ABDOMEN LIMITED - RIGHT UPPER QUADRANT COMPARISON:  August 15, 2015  FINDINGS: Gallbladder: Within the gallbladder, there is a a 9 mm in length echogenic focus which moves in shadows consistent with cholelithiasis. There is no gallbladder wall thickening or pericholecystic fluid. A septation is noted within the gallbladder, an anatomic variant. No sonographic Murphy sign noted by sonographer. Common bile duct: Diameter: 6 mm. No intrahepatic or extrahepatic biliary duct dilatation. Liver: No focal lesion identified. Within normal limits in parenchymal echogenicity. IMPRESSION: Cholelithiasis.  Study otherwise unremarkable. Electronically Signed   By: Lowella Grip III M.D.   On: 11/26/2015 17:31   Review of Systems  Constitutional: Negative for chills and fever.  Gastrointestinal: Positive for abdominal pain and nausea.   Physical Exam   Blood pressure 117/69, pulse 79, temperature 97.6 F (36.4 C), temperature source Oral, resp. rate 18, height 5' 5"  (1.651 m), weight 128 lb (58.1 kg).  Physical Exam  Constitutional: She is oriented to person, place, and time. She appears well-developed and well-nourished. No distress.  HENT:  Head: Normocephalic.  Eyes: Pupils are equal, round, and reactive to light.  Neck: Neck supple.  GI: Soft. Normal appearance. There is tenderness in the right upper quadrant and epigastric area. There is no rigidity, no rebound and no guarding.  Genitourinary:  Genitourinary Comments: Dilation: Closed Effacement (%): Thick Exam by:: Altamease Oiler, NP  Musculoskeletal: Normal range of motion.  Neurological: She is alert and oriented to person, place, and time.  Skin: Skin is warm. She is not diaphoretic.  Psychiatric: Her behavior is normal.    MAU Course  Procedures  None  MDM  CBC CMP Lipase LR bolus Dilaudid 1 mg Pepcid IV Right upper Quadrant [place] Discussed patient with Dr. Melba Coon  + fetal heart tones  [place] on call MD paged @ [place] Dr. Marlou Starks with [facility] called back and recommends the  patient call the office tomorrow to schedule an appointment.   Assessment and Plan   A:  1. Cholelithiasis affecting pregnancy in second trimester, antepartum   2. Upper abdominal pain     P:  Discharge home in stable condition Call [facility] tomorrow to schedule an appointment  Return to MAU if symptoms worsen Rx: ibuprofen, percocet    Lezlie Lye, NP 11/26/2015 8:03 PM

## 2015-12-15 ENCOUNTER — Encounter (HOSPITAL_COMMUNITY): Payer: Self-pay

## 2015-12-15 ENCOUNTER — Inpatient Hospital Stay (HOSPITAL_COMMUNITY)
Admission: AD | Admit: 2015-12-15 | Discharge: 2015-12-15 | Disposition: A | Discharge status: Home / Self Care | Payer: Medicaid Other | Source: Ambulatory Visit | Attending: Obstetrics and Gynecology | Admitting: Obstetrics and Gynecology

## 2015-12-15 DIAGNOSIS — O212 Late vomiting of pregnancy: Secondary | ICD-10-CM | POA: Insufficient documentation

## 2015-12-15 DIAGNOSIS — J45909 Unspecified asthma, uncomplicated: Secondary | ICD-10-CM | POA: Diagnosis not present

## 2015-12-15 DIAGNOSIS — Z3A24 24 weeks gestation of pregnancy: Secondary | ICD-10-CM | POA: Diagnosis not present

## 2015-12-15 DIAGNOSIS — O26612 Liver and biliary tract disorders in pregnancy, second trimester: Secondary | ICD-10-CM | POA: Diagnosis not present

## 2015-12-15 DIAGNOSIS — O99512 Diseases of the respiratory system complicating pregnancy, second trimester: Secondary | ICD-10-CM | POA: Diagnosis not present

## 2015-12-15 DIAGNOSIS — O26892 Other specified pregnancy related conditions, second trimester: Secondary | ICD-10-CM

## 2015-12-15 DIAGNOSIS — O99612 Diseases of the digestive system complicating pregnancy, second trimester: Secondary | ICD-10-CM | POA: Insufficient documentation

## 2015-12-15 DIAGNOSIS — R109 Unspecified abdominal pain: Secondary | ICD-10-CM | POA: Diagnosis not present

## 2015-12-15 DIAGNOSIS — M549 Dorsalgia, unspecified: Secondary | ICD-10-CM

## 2015-12-15 DIAGNOSIS — K8020 Calculus of gallbladder without cholecystitis without obstruction: Secondary | ICD-10-CM | POA: Diagnosis not present

## 2015-12-15 DIAGNOSIS — O219 Vomiting of pregnancy, unspecified: Secondary | ICD-10-CM | POA: Diagnosis not present

## 2015-12-15 DIAGNOSIS — O99891 Other specified diseases and conditions complicating pregnancy: Secondary | ICD-10-CM

## 2015-12-15 DIAGNOSIS — R1030 Lower abdominal pain, unspecified: Secondary | ICD-10-CM | POA: Diagnosis present

## 2015-12-15 DIAGNOSIS — O9989 Other specified diseases and conditions complicating pregnancy, childbirth and the puerperium: Secondary | ICD-10-CM

## 2015-12-15 LAB — WET PREP, GENITAL
CLUE CELLS WET PREP: NONE SEEN
SPERM: NONE SEEN
TRICH WET PREP: NONE SEEN
Yeast Wet Prep HPF POC: NONE SEEN

## 2015-12-15 LAB — URINALYSIS, ROUTINE W REFLEX MICROSCOPIC
BILIRUBIN URINE: NEGATIVE
GLUCOSE, UA: NEGATIVE mg/dL
Hgb urine dipstick: NEGATIVE
KETONES UR: NEGATIVE mg/dL
Leukocytes, UA: NEGATIVE
Nitrite: NEGATIVE
PH: 6.5 (ref 5.0–8.0)
Protein, ur: NEGATIVE mg/dL
SPECIFIC GRAVITY, URINE: 1.01 (ref 1.005–1.030)

## 2015-12-15 NOTE — MAU Provider Note (Signed)
Chief Complaint:  No chief complaint on file.   First Provider Initiated Contact with Patient 12/15/15 0345      HPI: Yvonne Gentry is a 25 y.o. G3P2002 at [redacted]w[redacted]d who presents to maternity admissions reporting onset of cramping lower abdominal and back pain at 1 am while at work. She reports the pain started and was severe, making it hard to walk, and was associated with nausea and vomiting. She also had some white vaginal discharge when vomiting but denies itching/burning.  She tried sitting down, drinking fluids, and changing positions but nothing helped the pain.  She was recently diagnosed with gallstones and is eating the low-fat diet.  She denies any changes to her diet/new foods prior to onset of symptoms. She reports good fetal movement, denies LOF, vaginal bleeding, vaginal itching/burning, urinary symptoms, h/a, dizziness, n/v, or fever/chills.    HPI  Past Medical History: Past Medical History:  Diagnosis Date  . Asthma   . Gallstones   . Pollen allergies     Past obstetric history: OB History  Gravida Para Term Preterm AB Living  3 2 2     2   SAB TAB Ectopic Multiple Live Births          1    # Outcome Date GA Lbr Len/2nd Weight Sex Delivery Anes PTL Lv  3 Current           2 Term 08/09/11 [redacted]w[redacted]d 08:20 / 00:05 7 lb 7.9 oz (3.4 kg) M Vag-Spont EPI  LIV     Birth Comments: No problems at birth  1 Term               Past Surgical History: Past Surgical History:  Procedure Laterality Date  . NO PAST SURGERIES      Family History: Family History  Problem Relation Age of Onset  . Hypertension Father   . Diabetes Brother     Social History: Social History  Substance Use Topics  . Smoking status: Never Smoker  . Smokeless tobacco: Never Used  . Alcohol use Yes     Comment: not since being pregnant    Allergies: No Known Allergies  Meds:  Prescriptions Prior to Admission  Medication Sig Dispense Refill Last Dose  . albuterol (PROVENTIL HFA;VENTOLIN  HFA) 108 (90 Base) MCG/ACT inhaler Inhale 2 puffs into the lungs every 6 (six) hours as needed for wheezing or shortness of breath.   Past Month at Unknown time  . albuterol (PROVENTIL) (2.5 MG/3ML) 0.083% nebulizer solution Take 2.5 mg by nebulization every 6 (six) hours as needed for wheezing or shortness of breath.   Past Month at Unknown time  . Prenatal MV-Min-FA-Omega-3 (PRENATAL GUMMIES/DHA & FA) 0.4-32.5 MG CHEW Chew 2 each by mouth daily.   12/14/2015 at Unknown time  . ibuprofen (ADVIL,MOTRIN) 600 MG tablet Take 1 tablet (600 mg total) by mouth every 6 (six) hours as needed. 15 tablet 0   . metoCLOPramide (REGLAN) 10 MG tablet Take 1 tablet (10 mg total) by mouth every 8 (eight) hours as needed for nausea. 30 tablet 0 11/25/2015 at Unknown time  . oxyCODONE-acetaminophen (PERCOCET/ROXICET) 5-325 MG tablet Take 1-2 tablets by mouth every 4 (four) hours as needed for severe pain. 15 tablet 0     ROS:  Review of Systems  Constitutional: Negative for chills, fatigue and fever.  Eyes: Negative for visual disturbance.  Respiratory: Negative for shortness of breath.   Cardiovascular: Negative for chest pain.  Gastrointestinal: Positive for abdominal pain,  nausea and vomiting.  Genitourinary: Positive for pelvic pain and vaginal discharge. Negative for difficulty urinating, dysuria, flank pain, vaginal bleeding and vaginal pain.  Neurological: Negative for dizziness and headaches.  Psychiatric/Behavioral: Negative.      I have reviewed patient's Past Medical Hx, Surgical Hx, Family Hx, Social Hx, medications and allergies.   Physical Exam   Patient Vitals for the past 24 hrs:  Temp Temp src Pulse Resp SpO2 Height Weight  12/15/15 0238 98.1 F (36.7 C) Oral 86 19 98 % 5\' 5"  (1.651 m) 148 lb (67.1 kg)   Constitutional: Well-developed, well-nourished female in no acute distress.  Cardiovascular: normal rate Respiratory: normal effort GI: Abd soft, non-tender, gravid appropriate for  gestational age.  MS: Extremities nontender, no edema, normal ROM Neurologic: Alert and oriented x 4.  GU: Neg CVAT.  PELVIC EXAM: Wet prep collected by blind swab  Dilation: Closed Effacement (%): Thick Cervical Position: Posterior Station: -3 Exam by:: L Leftwich-Kirby CNM  FHT:  Baseline 145 , moderate variability, accelerations present, no decelerations Contractions: rare, mild to palpation   Labs: Results for orders placed or performed during the hospital encounter of 12/15/15 (from the past 24 hour(s))  Urinalysis, Routine w reflex microscopic (not at [facility])     Status: None   Collection Time: 12/15/15  2:40 AM  Result Value Ref Range   Color, Urine YELLOW YELLOW   APPearance CLEAR CLEAR   Specific Gravity, Urine 1.010 1.005 - 1.030   pH 6.5 5.0 - 8.0   Glucose, UA NEGATIVE NEGATIVE mg/dL   Hgb urine dipstick NEGATIVE NEGATIVE   Bilirubin Urine NEGATIVE NEGATIVE   Ketones, ur NEGATIVE NEGATIVE mg/dL   Protein, ur NEGATIVE NEGATIVE mg/dL   Nitrite NEGATIVE NEGATIVE   Leukocytes, UA NEGATIVE NEGATIVE  Wet prep, genital     Status: Abnormal   Collection Time: 12/15/15  3:50 AM  Result Value Ref Range   Yeast Wet Prep HPF POC NONE SEEN NONE SEEN   Trich, Wet Prep NONE SEEN NONE SEEN   Clue Cells Wet Prep HPF POC NONE SEEN NONE SEEN   WBC, Wet Prep HPF POC FEW (A) NONE SEEN   Sperm NONE SEEN    --/--/O POS (07/06 0105)  Imaging:  [place] Abdomen Limited Ruq  Result Date: 11/26/2015 CLINICAL DATA:  Right-sided abdominal pain EXAM: US ABDOMEN LIMITED - RIGHT UPPER QUADRANT COMPARISON:  August 15, 2015 FINDINGS: Gallbladder: Within the gallbladder, there is a a 9 mm in length echogenic focus which moves in shadows consistent with cholelithiasis. There is no gallbladder wall thickening or pericholecystic fluid. A septation is noted within the gallbladder, an anatomic variant. No sonographic Murphy sign noted by sonographer. Common bile duct: Diameter: 6 mm. No intrahepatic or  extrahepatic biliary duct dilatation. Liver: No focal lesion identified. Within normal limits in parenchymal echogenicity. IMPRESSION: Cholelithiasis.  Study otherwise unremarkable. Electronically Signed   By: Bretta BangWilliam  Woodruff III M.D.   On: 11/26/2015 17:31    MAU Course/MDM: I have ordered labs and reviewed results. Reviewed pt previous gallbladder imaging. NST reviewed Consult Dr Hinton RaoBovard-Stuckert with presentation, exam findings and test results.  No evidence of preterm labor, acute abdominal process, or infection. Pain may be related to cholelitiasis.  Continue strict diet, f/u in office this week. Treatments in MAU included PO fluids.  Pt reports less intermittent pain but does still report constant back pain.  Recommend heat/ice/warm bath/Tylenol/pregnancy support belt for back pain   Pt stable at time of discharge.  Today's evaluation  included a work-up for preterm labor which can be life-threatening for both mom and baby.  Assessment: 1. Abdominal pain during pregnancy in second trimester   2. Back pain affecting pregnancy in second trimester   3. Nausea/vomiting in pregnancy   4. Cholelithiasis affecting pregnancy in second trimester, antepartum     Plan: Discharge home Preterm labor precautions and fetal kick counts Follow-up Information    Bovard-Stuckert, Jody, MD Follow up.   Specialty:  Obstetrics and Gynecology Why:  Follow up in the office this week, return to MAU as needed for emergencies Contact information: [address] [place] [address] [phone]            Medication List    TAKE these medications   albuterol (2.5 MG/3ML) 0.083% nebulizer solution Commonly known as:  PROVENTIL Take 2.5 mg by nebulization every 6 (six) hours as needed for wheezing or shortness of breath.   albuterol 108 (90 Base) MCG/ACT inhaler Commonly known as:  PROVENTIL HFA;VENTOLIN HFA Inhale 2 puffs into the lungs every 6 (six) hours as needed for  wheezing or shortness of breath.   ibuprofen 600 MG tablet Commonly known as:  ADVIL,MOTRIN Take 1 tablet (600 mg total) by mouth every 6 (six) hours as needed.   metoCLOPramide 10 MG tablet Commonly known as:  REGLAN Take 1 tablet (10 mg total) by mouth every 8 (eight) hours as needed for nausea.   oxyCODONE-acetaminophen 5-325 MG tablet Commonly known as:  PERCOCET/ROXICET Take 1-2 tablets by mouth every 4 (four) hours as needed for severe pain.   PRENATAL GUMMIES/DHA & FA 0.4-32.5 MG Chew Chew 2 each by mouth daily.       Sharen CounterLisa Leftwich-Kirby Certified Nurse-Midwife 12/15/2015 5:18 AM

## 2015-12-15 NOTE — Discharge Instructions (Signed)
Abdominal Pain During Pregnancy Abdominal pain is common in pregnancy. Most of the time, it does not cause harm. There are many causes of abdominal pain. Some causes are more serious than others. Some of the causes of abdominal pain in pregnancy are easily diagnosed. Occasionally, the diagnosis takes time to understand. Other times, the cause is not determined. Abdominal pain can be a sign that something is very wrong with the pregnancy, or the pain may have nothing to do with the pregnancy at all. For this reason, always tell your health care provider if you have any abdominal discomfort. HOME CARE INSTRUCTIONS  Monitor your abdominal pain for any changes. The following actions may help to alleviate any discomfort you are experiencing:  Do not have sexual intercourse or put anything in your vagina until your symptoms go away completely.  Get plenty of rest until your pain improves.  Drink clear fluids if you feel nauseous. Avoid solid food as long as you are uncomfortable or nauseous.  Only take over-the-counter or prescription medicine as directed by your health care provider.  Keep all follow-up appointments with your health care provider. SEEK IMMEDIATE MEDICAL CARE IF:  You are bleeding, leaking fluid, or passing tissue from the vagina.  You have increasing pain or cramping.  You have persistent vomiting.  You have painful or bloody urination.  You have a fever.  You notice a decrease in your baby's movements.  You have extreme weakness or feel faint.  You have shortness of breath, with or without abdominal pain.  You develop a severe headache with abdominal pain.  You have abnormal vaginal discharge with abdominal pain.  You have persistent diarrhea.  You have abdominal pain that continues even after rest, or gets worse. MAKE SURE YOU:   Understand these instructions.  Will watch your condition.  Will get help right away if you are not doing well or get worse.     This information is not intended to replace advice given to you by your health care provider. Make sure you discuss any questions you have with your health care provider.   Document Released: 01/25/2005 Document Revised: 11/15/2012 Document Reviewed: 08/24/2012 Elsevier Interactive Patient Education 2016 [employer]  Cholelithiasis Cholelithiasis (also called gallstones) is a form of gallbladder disease in which gallstones form in your gallbladder. The gallbladder is an organ that stores bile made in the liver, which helps digest fats. Gallstones begin as small crystals and slowly grow into stones. Gallstone pain occurs when the gallbladder spasms and a gallstone is blocking the duct. Pain can also occur when a stone passes out of the duct.  RISK FACTORS  Being female.   Having multiple pregnancies. Health care providers sometimes advise removing diseased gallbladders before future pregnancies.   Being obese.  Eating a diet heavy in fried foods and fat.   Being older than 60 years and increasing age.   Prolonged use of medicines containing female hormones.   Having diabetes mellitus.   Rapidly losing weight.   Having a family history of gallstones (heredity).  SYMPTOMS  Nausea.   Vomiting.  Abdominal pain.   Yellowing of the skin (jaundice).   Sudden pain. It may persist from several minutes to several hours.  Fever.   Tenderness to the touch. In some cases, when gallstones do not move into the bile duct, people have no pain or symptoms. These are called "silent" gallstones.  TREATMENT Silent gallstones do not need treatment. In severe cases, emergency surgery may be required.  Options for treatment include:  Surgery to remove the gallbladder. This is the most common treatment.  Medicines. These do not always work and may take 6-12 months or more to work.  Shock wave treatment (extracorporeal biliary lithotripsy). In this treatment an ultrasound  machine sends shock waves to the gallbladder to break gallstones into smaller pieces that can pass into the intestines or be dissolved by medicine. HOME CARE INSTRUCTIONS   Only take over-the-counter or prescription medicines for pain, discomfort, or fever as directed by your health care provider.   Follow a low-fat diet until seen again by your health care provider. Fat causes the gallbladder to contract, which can result in pain.   Follow up with your health care provider as directed. Attacks are almost always recurrent and surgery is usually required for permanent treatment.  SEEK IMMEDIATE MEDICAL CARE IF:   Your pain increases and is not controlled by medicines.   You have a fever or persistent symptoms for more than 2-3 days.   You have a fever and your symptoms suddenly get worse.   You have persistent nausea and vomiting.  MAKE SURE YOU:   Understand these instructions.  Will watch your condition.  Will get help right away if you are not doing well or get worse.   This information is not intended to replace advice given to you by your health care provider. Make sure you discuss any questions you have with your health care provider.   Document Released: 01/21/2005 Document Revised: 09/27/2012 Document Reviewed: 07/19/2012 Elsevier Interactive Patient Education [employer] Elsevier Inc.

## 2015-12-15 NOTE — MAU Note (Signed)
Pt reports since 1 am she has been having lower back and lower abd pain, also reports vomiting x one and chills. Denies bleeding at this time.

## 2016-01-12 ENCOUNTER — Inpatient Hospital Stay (HOSPITAL_COMMUNITY): Payer: Medicaid Other

## 2016-01-12 ENCOUNTER — Encounter (HOSPITAL_COMMUNITY): Payer: Self-pay | Admitting: Certified Nurse Midwife

## 2016-01-12 ENCOUNTER — Inpatient Hospital Stay (HOSPITAL_COMMUNITY)
Admission: AD | Admit: 2016-01-12 | Discharge: 2016-01-12 | Disposition: A | Discharge status: Home / Self Care | Payer: Medicaid Other | Source: Ambulatory Visit | Attending: Obstetrics and Gynecology | Admitting: Obstetrics and Gynecology

## 2016-01-12 DIAGNOSIS — R3129 Other microscopic hematuria: Secondary | ICD-10-CM | POA: Diagnosis not present

## 2016-01-12 DIAGNOSIS — M545 Low back pain: Secondary | ICD-10-CM | POA: Insufficient documentation

## 2016-01-12 DIAGNOSIS — O210 Mild hyperemesis gravidarum: Secondary | ICD-10-CM | POA: Insufficient documentation

## 2016-01-12 DIAGNOSIS — M549 Dorsalgia, unspecified: Secondary | ICD-10-CM

## 2016-01-12 DIAGNOSIS — O99891 Other specified diseases and conditions complicating pregnancy: Secondary | ICD-10-CM

## 2016-01-12 DIAGNOSIS — O219 Vomiting of pregnancy, unspecified: Secondary | ICD-10-CM

## 2016-01-12 DIAGNOSIS — Z3A28 28 weeks gestation of pregnancy: Secondary | ICD-10-CM | POA: Insufficient documentation

## 2016-01-12 DIAGNOSIS — O26893 Other specified pregnancy related conditions, third trimester: Secondary | ICD-10-CM | POA: Insufficient documentation

## 2016-01-12 DIAGNOSIS — O9989 Other specified diseases and conditions complicating pregnancy, childbirth and the puerperium: Secondary | ICD-10-CM

## 2016-01-12 LAB — FETAL FIBRONECTIN: Fetal Fibronectin: POSITIVE — AB

## 2016-01-12 LAB — URINALYSIS, ROUTINE W REFLEX MICROSCOPIC
BILIRUBIN URINE: NEGATIVE
GLUCOSE, UA: NEGATIVE mg/dL
KETONES UR: NEGATIVE mg/dL
Leukocytes, UA: NEGATIVE
Nitrite: NEGATIVE
PH: 6.5 (ref 5.0–8.0)
Protein, ur: NEGATIVE mg/dL
Specific Gravity, Urine: 1.01 (ref 1.005–1.030)

## 2016-01-12 LAB — CBC
HEMATOCRIT: 31.7 % — AB (ref 36.0–46.0)
Hemoglobin: 11 g/dL — ABNORMAL LOW (ref 12.0–15.0)
MCH: 29.9 pg (ref 26.0–34.0)
MCHC: 34.7 g/dL (ref 30.0–36.0)
MCV: 86.1 fL (ref 78.0–100.0)
Platelets: 232 10*3/uL (ref 150–400)
RBC: 3.68 MIL/uL — ABNORMAL LOW (ref 3.87–5.11)
RDW: 12.7 % (ref 11.5–15.5)
WBC: 7.7 10*3/uL (ref 4.0–10.5)

## 2016-01-12 LAB — URINE MICROSCOPIC-ADD ON
Bacteria, UA: NONE SEEN
WBC, UA: NONE SEEN WBC/hpf (ref 0–5)

## 2016-01-12 MED ORDER — ONDANSETRON HCL 4 MG/2ML IJ SOLN
4.0000 mg | Freq: Once | INTRAMUSCULAR | Status: AC
Start: 2016-01-12 — End: 2016-01-12
  Administered 2016-01-12: 4 mg via INTRAVENOUS
  Filled 2016-01-12: qty 2

## 2016-01-12 MED ORDER — LACTATED RINGERS IV BOLUS (SEPSIS)
1000.0000 mL | Freq: Once | INTRAVENOUS | Status: AC
  Administered 2016-01-12: 1000 mL via INTRAVENOUS

## 2016-01-12 MED ORDER — ACETAMINOPHEN 325 MG PO TABS
650.0000 mg | ORAL_TABLET | Freq: Once | ORAL | Status: AC
  Administered 2016-01-12: 650 mg via ORAL
  Filled 2016-01-12: qty 2

## 2016-01-12 MED ORDER — CYCLOBENZAPRINE HCL 10 MG PO TABS
10.0000 mg | ORAL_TABLET | Freq: Once | ORAL | Status: AC
  Administered 2016-01-12: 10 mg via ORAL
  Filled 2016-01-12: qty 1

## 2016-01-12 MED ORDER — CYCLOBENZAPRINE HCL 5 MG PO TABS: 5.0000 mg | ORAL_TABLET | Freq: Three times a day (TID) | ORAL | 0 refills | Status: DC | PRN

## 2016-01-12 MED ORDER — ONDANSETRON 4 MG PO TBDP: 4.0000 mg | ORAL_TABLET | Freq: Three times a day (TID) | ORAL | 0 refills | Status: DC | PRN

## 2016-01-12 NOTE — MAU Note (Signed)
Really bad back pain.  Woke her around 0400, tried heating pad. Constant with increased waves. Hasn't been able to keep anything down today. Kind of feels like Yvonne PeltonBraxton Hicks that just won't stop

## 2016-01-12 NOTE — MAU Provider Note (Signed)
History     CSN: [id number]  Arrival date and time: 01/12/16 [id number]   First Provider Initiated Contact with Patient 01/12/16 1835      Chief Complaint  Patient presents with  . Back Pain  . Emesis   HPI  Yvonne Gentry is a 25 y.o. G3P2002 at 2179w0d who presents with back pain & n/v. Reports nausea & vomiting since yesterday. Has vomited 4 times today and has trouble keeping down food. Takes diclegis nightly and took it last night without relief.  Woke up at 4 am this morning with low back pain. Describes as dull throbbing pain in her low back. Pain is constant and worse with movement & position changes. Rates pain 8/10. Attempted to take ibuprofen for pain but was unable to keep it down. Has applied heating pad to area without relief. Feels occasional tightening of abdomen & is concerned that the back pain she is experiencing is related to braxton hicks ctx.  Denies recent fall or injury, fever/chills, dysuria, hematuria, flank pain, vaginal bleeding, LOF, or diarrhea. Positive fetal movement.   OB History    Gravida Para Term Preterm AB Living   3 2 2     2    SAB TAB Ectopic Multiple Live Births           1      Past Medical History:  Diagnosis Date  . Asthma   . Gallstones   . Pollen allergies     Past Surgical History:  Procedure Laterality Date  . NO PAST SURGERIES      Family History  Problem Relation Age of Onset  . Hypertension Father   . Diabetes Brother     Social History  Substance Use Topics  . Smoking status: Never Smoker  . Smokeless tobacco: Never Used  . Alcohol use Yes     Comment: not since being pregnant    Allergies: No Known Allergies  Prescriptions Prior to Admission  Medication Sig Dispense Refill Last Dose  . metoCLOPramide (REGLAN) 10 MG tablet Take by mouth.     . oxyCODONE-acetaminophen (PERCOCET/ROXICET) 5-325 MG tablet Take by mouth.     Marland Kitchen. albuterol (PROVENTIL HFA;VENTOLIN HFA) 108 (90 Base) MCG/ACT inhaler Inhale 2 puffs into  the lungs every 6 (six) hours as needed for wheezing or shortness of breath.   Past Month at Unknown time  . albuterol (PROVENTIL) (2.5 MG/3ML) 0.083% nebulizer solution Take 2.5 mg by nebulization every 6 (six) hours as needed for wheezing or shortness of breath.   Past Month at Unknown time  . DOCOSAHEXAENOIC ACID PO Take by mouth.     Marland Kitchen. ibuprofen (ADVIL,MOTRIN) 600 MG tablet Take 1 tablet (600 mg total) by mouth every 6 (six) hours as needed. 15 tablet 0   . metoCLOPramide (REGLAN) 10 MG tablet Take 1 tablet (10 mg total) by mouth every 8 (eight) hours as needed for nausea. 30 tablet 0 11/25/2015 at Unknown time  . oxyCODONE-acetaminophen (PERCOCET/ROXICET) 5-325 MG tablet Take 1-2 tablets by mouth every 4 (four) hours as needed for severe pain. 15 tablet 0   . Prenatal MV-Min-FA-Omega-3 (PRENATAL GUMMIES/DHA & FA) 0.4-32.5 MG CHEW Chew 2 each by mouth daily.   12/14/2015 at Unknown time    Review of Systems  Constitutional: Negative for chills and fever.  Gastrointestinal: Positive for nausea and vomiting. Negative for abdominal pain ("tightening"), constipation and diarrhea.  Genitourinary: Negative.  Negative for dysuria, flank pain, frequency and hematuria.  Musculoskeletal: Positive for back pain. Negative  for falls.   Physical Exam   Blood pressure 108/67, pulse 114, temperature 98.1 F (36.7 C), temperature source Oral, resp. rate 18, weight 150 lb 4 oz (68.2 kg).  Physical Exam  Nursing note and vitals reviewed. Constitutional: She is oriented to person, place, and time. She appears well-developed and well-nourished. No distress.  HENT:  Head: Normocephalic and atraumatic.  Eyes: Conjunctivae are normal. Right eye exhibits no discharge. Left eye exhibits no discharge. No scleral icterus.  Neck: Normal range of motion.  Cardiovascular: Normal rate, regular rhythm and normal heart sounds.   No murmur heard. Respiratory: Effort normal and breath sounds normal. No respiratory  distress. She has no wheezes.  GI: Soft. There is no tenderness. There is no CVA tenderness.  Musculoskeletal:       Lumbar back: She exhibits tenderness and pain.  Neurological: She is alert and oriented to person, place, and time.  Skin: Skin is warm and dry. She is not diaphoretic.  Psychiatric: She has a normal mood and affect. Her behavior is normal. Judgment and thought content normal.   Dilation: 1 Effacement (%): Thick Cervical Position: Posterior Station: -3 Exam by:: Estanislado Spire NP  Fetal Tracing:  Baseline: 145 Variability: moderate Accelerations: 15x15 Decelerations: none  Toco: UI that resolved with IVF  MAU Course  Procedures Results for orders placed or performed during the hospital encounter of 01/12/16 (from the past 24 hour(s))  Urinalysis, Routine w reflex microscopic (not at [facility])     Status: Abnormal   Collection Time: 01/12/16  6:26 PM  Result Value Ref Range   Color, Urine YELLOW YELLOW   APPearance CLEAR CLEAR   Specific Gravity, Urine 1.010 1.005 - 1.030   pH 6.5 5.0 - 8.0   Glucose, UA NEGATIVE NEGATIVE mg/dL   Hgb urine dipstick MODERATE (A) NEGATIVE   Bilirubin Urine NEGATIVE NEGATIVE   Ketones, ur NEGATIVE NEGATIVE mg/dL   Protein, ur NEGATIVE NEGATIVE mg/dL   Nitrite NEGATIVE NEGATIVE   Leukocytes, UA NEGATIVE NEGATIVE  Urine microscopic-add on     Status: Abnormal   Collection Time: 01/12/16  6:26 PM  Result Value Ref Range   Squamous Epithelial / LPF 0-5 (A) NONE SEEN   WBC, UA NONE SEEN 0 - 5 WBC/hpf   RBC / HPF 6-30 0 - 5 RBC/hpf   Bacteria, UA NONE SEEN NONE SEEN  CBC     Status: Abnormal   Collection Time: 01/12/16  6:50 PM  Result Value Ref Range   WBC 7.7 4.0 - 10.5 K/uL   RBC 3.68 (L) 3.87 - 5.11 MIL/uL   Hemoglobin 11.0 (L) 12.0 - 15.0 g/dL   HCT 96.0 (L) 45.4 - 09.8 %   MCV 86.1 78.0 - 100.0 fL   MCH 29.9 26.0 - 34.0 pg   MCHC 34.7 30.0 - 36.0 g/dL   RDW 11.9 14.7 - 82.9 %   Platelets 232 150 - 400 K/uL  Fetal  fibronectin     Status: Abnormal   Collection Time: 01/12/16  6:57 PM  Result Value Ref Range   Fetal Fibronectin POSITIVE (A) NEGATIVE   US Renal  Result Date: 01/12/2016 CLINICAL DATA:  Pregnant patient with flank pain and hematuria. No known injury. EXAM: RENAL / URINARY TRACT ULTRASOUND COMPLETE COMPARISON:  Abdominal ultrasound 08/15/2015. FINDINGS: Right Kidney: Length: 11.0 cm. Echogenicity within normal limits. No mass or hydronephrosis visualized. Left Kidney: Length: 10.9 cm. Echogenicity within normal limits. No mass or hydronephrosis visualized. Bladder: Appears normal for degree of bladder  distention. IMPRESSION: Negative exam. Electronically Signed   By: Drusilla Kannerhomas  Dalessio M.D.   On: 01/12/2016 20:54    MDM Category 1 tracing IV fluid bolus, zofran 4 mg IV, & flexeril 10 mg PO Pt reports improvement in nausea; back pain unchanged Normal renal ultrasound FFN +; UI resolved with IV fluids & SVE unchanged S/w Dr. Jackelyn KnifeMeisinger. Will d/c home with zofran & flexeril. Pt to call office in morning to update on her symptoms.   Assessment and Plan  A: 1. Nausea and vomiting during pregnancy   2. Microscopic hematuria   3. Back pain affecting pregnancy in third trimester    P: Discharge home Rx flexeril & zofran D/c ibuprofen Take tylenol prn  Discussed reasons to return to MAU Call office in morning Urine culture pending  Judeth Hornrin Jordyan Hardiman 01/12/2016, 6:32 PM

## 2016-01-12 NOTE — Discharge Instructions (Signed)
Back Pain in Pregnancy Introduction Back pain during pregnancy is common. Back pain may be caused by several factors that are related to changes during your pregnancy. Follow these instructions at home: Managing pain, stiffness, and swelling  If directed, apply ice for sudden (acute) back pain.  Put ice in a plastic bag.  Place a towel between your skin and the bag.  Leave the ice on for 20 minutes, 2-3 times per day.  If directed, apply heat to the affected area before you exercise:  Place a towel between your skin and the heat pack or heating pad.  Leave the heat on for 20-30 minutes.  Remove the heat if your skin turns bright red. This is especially important if you are unable to feel pain, heat, or cold. You may have a greater risk of getting burned. Activity  Exercise as told by your health care provider. Exercising is the best way to prevent or manage back pain.  Listen to your body when lifting. If lifting hurts, ask for help or bend your knees. This uses your leg muscles instead of your back muscles.  Squat down when picking up something from the floor. Do not bend over.  Only use bed rest as told by your health care provider. Bed rest should only be used for the most severe episodes of back pain. Standing, Sitting, and Lying Down  Do not stand in one place for long periods of time.  Use good posture when sitting. Make sure your head rests over your shoulders and is not hanging forward. Use a pillow on your lower back if necessary.  Try sleeping on your side, preferably the left side, with a pillow or two between your legs. If you are sore after a night's rest, your bed may be too soft. A firm mattress may provide more support for your back during pregnancy. General instructions  Do not wear high heels.  Eat a healthy diet. Try to gain weight within your health care provider's recommendations.  Use a maternity girdle, elastic sling, or back brace as told by your  health care provider.  Take over-the-counter and prescription medicines only as told by your health care provider.  Keep all follow-up visits as told by your health care provider. This is important. This includes any visits with any specialists, such as a physical therapist. Contact a health care provider if:  Your back pain interferes with your daily activities.  You have increasing pain in other parts of your body. Get help right away if:  You develop numbness, tingling, weakness, or problems with the use of your arms or legs.  You develop severe back pain that is not controlled with medicine.  You have a sudden change in bowel or bladder control.  You develop shortness of breath, dizziness, or you faint.  You develop nausea, vomiting, or sweating.  You have back pain that is a rhythmic, cramping pain similar to labor pains. Labor pain is usually 1-2 minutes apart, lasts for about 1 minute, and involves a bearing down feeling or pressure in your pelvis.  You have back pain and your water breaks or you have vaginal bleeding.  You have back pain or numbness that travels down your leg.  Your back pain developed after you fell.  You develop pain on one side of your back.  You see blood in your urine.  You develop skin blisters in the area of your back pain. This information is not intended to replace advice given to   you by your health care provider. Make sure you discuss any questions you have with your health care provider. Document Released: 05/05/2005 Document Revised: 07/03/2015 Document Reviewed: 10/09/2014  2017 Elsevier  

## 2016-01-14 LAB — CULTURE, OB URINE

## 2016-01-17 NOTE — Progress Notes (Signed)
FHT from 12-4 reviewed.  Reactive NST for 28 weeks, maybe a few small variables, irreg ctx.

## 2016-01-24 ENCOUNTER — Encounter (HOSPITAL_COMMUNITY): Payer: Self-pay | Admitting: *Deleted

## 2016-01-24 ENCOUNTER — Inpatient Hospital Stay (HOSPITAL_COMMUNITY)
Admission: AD | Admit: 2016-01-24 | Discharge: 2016-01-24 | Disposition: A | Discharge status: Home / Self Care | Payer: Medicaid Other | Source: Ambulatory Visit | Attending: Obstetrics & Gynecology | Admitting: Obstetrics & Gynecology

## 2016-01-24 DIAGNOSIS — O26893 Other specified pregnancy related conditions, third trimester: Secondary | ICD-10-CM | POA: Insufficient documentation

## 2016-01-24 DIAGNOSIS — R1030 Lower abdominal pain, unspecified: Secondary | ICD-10-CM | POA: Insufficient documentation

## 2016-01-24 DIAGNOSIS — Z3A29 29 weeks gestation of pregnancy: Secondary | ICD-10-CM | POA: Insufficient documentation

## 2016-01-24 DIAGNOSIS — R109 Unspecified abdominal pain: Secondary | ICD-10-CM

## 2016-01-24 DIAGNOSIS — O4703 False labor before 37 completed weeks of gestation, third trimester: Secondary | ICD-10-CM

## 2016-01-24 DIAGNOSIS — O4700 False labor before 37 completed weeks of gestation, unspecified trimester: Secondary | ICD-10-CM

## 2016-01-24 MED ORDER — ONDANSETRON 8 MG PO TBDP
8.0000 mg | ORAL_TABLET | Freq: Once | ORAL | Status: AC
  Administered 2016-01-24: 8 mg via ORAL
  Filled 2016-01-24: qty 1

## 2016-01-24 MED ORDER — NIFEDIPINE 10 MG PO CAPS: 10.0000 mg | ORAL_CAPSULE | Freq: Three times a day (TID) | ORAL | 0 refills | Status: DC

## 2016-01-24 MED ORDER — NIFEDIPINE 10 MG PO CAPS
10.0000 mg | ORAL_CAPSULE | Freq: Once | ORAL | Status: AC
  Administered 2016-01-24: 10 mg via ORAL
  Filled 2016-01-24: qty 1

## 2016-01-24 MED ORDER — LACTATED RINGERS IV BOLUS (SEPSIS): 1000.0000 mL | Freq: Once | INTRAVENOUS | Status: DC

## 2016-01-24 NOTE — Discharge Instructions (Signed)
Abdominal Pain During Pregnancy °Belly (abdominal) pain is common during pregnancy. Most of the time, it is not a serious problem. Other times, it can be a sign that something is wrong with the pregnancy. Always tell your doctor if you have belly pain. °Follow these instructions at home: °Monitor your belly pain for any changes. The following actions may help you feel better: °· Do not have sex (intercourse) or put anything in your vagina until you feel better. °· Rest until your pain stops. °· Drink clear fluids if you feel sick to your stomach (nauseous). Do not eat solid food until you feel better. °· Only take medicine as told by your doctor. °· Keep all doctor visits as told. °Get help right away if: °· You are bleeding, leaking fluid, or pieces of tissue come out of your vagina. °· You have more pain or cramping. °· You keep throwing up (vomiting). °· You have pain when you pee (urinate) or have blood in your pee. °· You have a fever. °· You do not feel your baby moving as much. °· You feel very weak or feel like passing out. °· You have trouble breathing, with or without belly pain. °· You have a very bad headache and belly pain. °· You have fluid leaking from your vagina and belly pain. °· You keep having watery poop (diarrhea). °· Your belly pain does not go away after resting, or the pain gets worse. °This information is not intended to replace advice given to you by your health care provider. Make sure you discuss any questions you have with your health care provider. °Document Released: 01/13/2009 Document Revised: 09/03/2015 Document Reviewed: 08/24/2012 °Elsevier Interactive Patient Education © 2017 Elsevier Inc. ° °

## 2016-01-24 NOTE — MAU Note (Signed)
Sudden onset of abd pain in lower abd, pressure.  Intense.  Started vomiting at the same time.  Denies bleeding or leaking.  Was 1 cm a couple wks ago.

## 2016-01-24 NOTE — MAU Provider Note (Signed)
History   G3P2002 @ 29.5 wks in with constant sharp low abd pain one hour ago.  CSN: [id number]  Arrival date & time 01/24/16  1039   First Provider Initiated Contact with Patient 01/24/16 1058      Chief Complaint  Patient presents with  . Contractions  . Emesis    HPI  Past Medical History:  Diagnosis Date  . Asthma   . Gallstones   . Pollen allergies     Past Surgical History:  Procedure Laterality Date  . NO PAST SURGERIES      Family History  Problem Relation Age of Onset  . Hypertension Father   . Diabetes Brother     Social History  Substance Use Topics  . Smoking status: Never Smoker  . Smokeless tobacco: Never Used  . Alcohol use Yes     Comment: not since being pregnant    OB History    Gravida Para Term Preterm AB Living   3 2 2     2    SAB TAB Ectopic Multiple Live Births           1      Review of Systems  Constitutional: Negative.   HENT: Negative.   Eyes: Negative.   Respiratory: Negative.   Cardiovascular: Negative.   Gastrointestinal: Negative.   Endocrine: Negative.   Genitourinary: Negative.   Musculoskeletal: Negative.   Skin: Negative.   Allergic/Immunologic: Negative.   Neurological: Negative.   Hematological: Negative.   Psychiatric/Behavioral: Negative.     Allergies  Patient has no known allergies.  Home Medications    BP 122/78 (BP Location: Left Arm)   Pulse 92   Temp 98.5 F (36.9 C)   Resp 20   LMP  (Within Months)   Physical Exam  Constitutional: She is oriented to person, place, and time. She appears well-developed and well-nourished.  HENT:  Head: Normocephalic.  Eyes: Pupils are equal, round, and reactive to light.  Neck: Normal range of motion.  Cardiovascular: Normal rate, regular rhythm, normal heart sounds and intact distal pulses.   Pulmonary/Chest: Effort normal and breath sounds normal.  Abdominal: Soft. Bowel sounds are normal.  Genitourinary: Vagina normal and uterus normal.   Musculoskeletal: Normal range of motion.  Neurological: She is alert and oriented to person, place, and time. She has normal reflexes.  Skin: Skin is warm and dry.  Psychiatric: She has a normal mood and affect. Her behavior is normal. Judgment and thought content normal.    MAU Course  Procedures (including critical care time)  Labs Reviewed - No data to display No results found.   No diagnosis found.    MDM  SVE ft/th/posthigh. Will d/c home after one hour with no cervical change. 1309 after several hour of observation and recheck of her cervix there has been no change in cervix. Will d/c home on procardia for comfort.

## 2016-02-06 ENCOUNTER — Ambulatory Visit (INDEPENDENT_AMBULATORY_CARE_PROVIDER_SITE_OTHER): Payer: Medicaid Other | Admitting: Family Medicine

## 2016-02-06 ENCOUNTER — Encounter: Payer: Self-pay | Admitting: Family Medicine

## 2016-02-06 VITALS — BP 128/66 | HR 89 | Wt 160.0 lb

## 2016-02-06 DIAGNOSIS — Z3480 Encounter for supervision of other normal pregnancy, unspecified trimester: Secondary | ICD-10-CM | POA: Diagnosis not present

## 2016-02-06 DIAGNOSIS — Z3483 Encounter for supervision of other normal pregnancy, third trimester: Secondary | ICD-10-CM | POA: Diagnosis not present

## 2016-02-06 LAB — POCT URINALYSIS DIP (DEVICE)
BILIRUBIN URINE: NEGATIVE
Glucose, UA: NEGATIVE mg/dL
Hgb urine dipstick: NEGATIVE
Ketones, ur: NEGATIVE mg/dL
Leukocytes, UA: NEGATIVE
NITRITE: NEGATIVE
PH: 6 (ref 5.0–8.0)
PROTEIN: NEGATIVE mg/dL
Specific Gravity, Urine: 1.025 (ref 1.005–1.030)
Urobilinogen, UA: 0.2 mg/dL (ref 0.0–1.0)

## 2016-02-06 NOTE — Progress Notes (Signed)
   PRENATAL VISIT NOTE  Subjective:  Yvonne Gentry is a 25 y.o. G3P2002 at 5642w4d being seen today for Initial prenatal care. [facility] Was discharged from their practice due to changing dates on work restriction forms. She is currently monitored for the following issues for this low-risk pregnancy and has Supervision of other normal pregnancy, antepartum on her problem list.  Patient reports no complaints.  Contractions: Irritability. Vag. Bleeding: None.  Movement: Present. Denies leaking of fluid.   The following portions of the patient's history were reviewed and updated as appropriate: allergies, current medications, past family history, past medical history, past social history, past surgical history and problem list. Problem list updated.  Objective:   Vitals:   02/06/16 1020  BP: 128/66  Pulse: 89  Weight: 160 lb (72.6 kg)    Fetal Status: Fetal Heart Rate (bpm): 140 Fundal Height: 32 cm Movement: Present     General:  Alert, oriented and cooperative. Patient is in no acute distress.  Skin: Skin is warm and dry. No rash noted.   Cardiovascular: Normal heart rate noted  Respiratory: Normal respiratory effort, no problems with respiration noted  Abdomen: Soft, gravid, appropriate for gestational age. Pain/Pressure: Present     Pelvic:  Cervical exam deferred        Extremities: Normal range of motion.  Edema: None  Mental Status: Normal mood and affect. Normal behavior. Normal judgment and thought content.   Assessment and Plan:  Pregnancy: G3P2002 at 6742w4d  1. Supervision of other normal pregnancy, antepartum FHT and FH normal. Discussed nature of practice. Reviewed PNL.  Preterm labor symptoms and general obstetric precautions including but not limited to vaginal bleeding, contractions, leaking of fluid and fetal movement were reviewed in detail with the patient. Please refer to After Visit Summary for other counseling recommendations.  Return in  about 2 weeks (around 02/20/2016) for OB f/u.   Levie HeritageJacob J Stinson, DO

## 2016-02-09 NOTE — L&D Delivery Note (Signed)
Delivery Note At  a viable female was delivered via  (Presentation: LOA ).  APGAR: 8, 9; weight  .   Placenta status: spontaneous, intact.  Cord:  with the following complications: none.   Anesthesia:  None Episiotomy: None Lacerations: None Suture Repair: N/A Est. Blood Loss (mL): 100  Mom to postpartum.  Baby to Couplet care / Skin to Skin.  Wendee Beaversavid J McMullen, DO, PGY-1 03/28/2016, 9:23 AM

## 2016-02-16 ENCOUNTER — Inpatient Hospital Stay (HOSPITAL_COMMUNITY)
Admission: AD | Admit: 2016-02-16 | Discharge: 2016-02-17 | Disposition: A | Discharge status: Home / Self Care | Payer: Medicaid Other | Source: Ambulatory Visit | Attending: Family Medicine | Admitting: Family Medicine

## 2016-02-16 ENCOUNTER — Encounter (HOSPITAL_COMMUNITY): Payer: Self-pay

## 2016-02-16 DIAGNOSIS — G43909 Migraine, unspecified, not intractable, without status migrainosus: Secondary | ICD-10-CM | POA: Diagnosis not present

## 2016-02-16 DIAGNOSIS — R519 Headache, unspecified: Secondary | ICD-10-CM

## 2016-02-16 DIAGNOSIS — O26893 Other specified pregnancy related conditions, third trimester: Secondary | ICD-10-CM | POA: Insufficient documentation

## 2016-02-16 DIAGNOSIS — Z3A33 33 weeks gestation of pregnancy: Secondary | ICD-10-CM | POA: Diagnosis not present

## 2016-02-16 DIAGNOSIS — R51 Headache: Secondary | ICD-10-CM | POA: Insufficient documentation

## 2016-02-16 DIAGNOSIS — O99513 Diseases of the respiratory system complicating pregnancy, third trimester: Secondary | ICD-10-CM | POA: Diagnosis not present

## 2016-02-16 DIAGNOSIS — J45909 Unspecified asthma, uncomplicated: Secondary | ICD-10-CM | POA: Diagnosis not present

## 2016-02-16 MED ORDER — DEXAMETHASONE SODIUM PHOSPHATE 10 MG/ML IJ SOLN
10.0000 mg | Freq: Once | INTRAMUSCULAR | Status: AC
  Administered 2016-02-16: 10 mg via INTRAVENOUS
  Filled 2016-02-16: qty 1

## 2016-02-16 MED ORDER — DIPHENHYDRAMINE HCL 50 MG/ML IJ SOLN
25.0000 mg | Freq: Once | INTRAMUSCULAR | Status: AC
  Administered 2016-02-16: 25 mg via INTRAVENOUS
  Filled 2016-02-16: qty 1

## 2016-02-16 MED ORDER — SODIUM CHLORIDE 0.9 % IV SOLN
INTRAVENOUS | Status: DC
  Administered 2016-02-16: 23:00:00 via INTRAVENOUS

## 2016-02-16 MED ORDER — METOCLOPRAMIDE HCL 5 MG/ML IJ SOLN
10.0000 mg | Freq: Once | INTRAMUSCULAR | Status: AC
  Administered 2016-02-16: 10 mg via INTRAVENOUS
  Filled 2016-02-16: qty 2

## 2016-02-16 NOTE — MAU Provider Note (Signed)
History     CSN: [id number]  Arrival date and time: 02/16/16 2130   First Provider Initiated Contact with Patient 02/16/16 2223      Chief Complaint  Patient presents with  . Headache  . Abdominal Pain   HPI  Patient is a 26 y.o. G3P2002 at 7136w0d who arrives via EMS presents with a migraine headache x 5 hours ago. Headache is frontal and bilateral. Headache is described as throbbing. Sensitive to light and sound. No aura. She notes that she has never had a migraine before but sometimes she gets headaches. Denies any visual changes, weakness, numbness. Admits to come intermittent contractions. No LOF, vaginal bleeding, dysuria. Positive fetal movement.   OB History    Gravida Para Term Preterm AB Living   3 2 2  0 0 2   SAB TAB Ectopic Multiple Live Births   0 0 0 0 2      Past Medical History:  Diagnosis Date  . Asthma   . Gallstones   . Pollen allergies     Past Surgical History:  Procedure Laterality Date  . NO PAST SURGERIES      Family History  Problem Relation Age of Onset  . Hypertension Father   . Diabetes Brother   . Cancer Mother     Social History  Substance Use Topics  . Smoking status: Never Smoker  . Smokeless tobacco: Never Used  . Alcohol use Yes     Comment: not since being pregnant    Allergies: No Known Allergies  Prescriptions Prior to Admission  Medication Sig Dispense Refill Last Dose  . calcium carbonate (TUMS - DOSED IN MG ELEMENTAL CALCIUM) 500 MG chewable tablet Chew 2 tablets by mouth daily as needed for indigestion or heartburn.   02/15/2016 at Unknown time  . cyclobenzaprine (FLEXERIL) 5 MG tablet Take 1 tablet (5 mg total) by mouth 3 (three) times daily as needed for muscle spasms. 15 tablet 0 Past Month at Unknown time  . ferrous sulfate 325 (65 FE) MG tablet Take 325 mg by mouth daily with breakfast.   02/16/2016 at Unknown time  . NIFEdipine (PROCARDIA) 10 MG capsule Take 1 capsule (10 mg total) by mouth 3 (three) times daily. 90  capsule 0 02/16/2016 at Unknown time  . ondansetron (ZOFRAN ODT) 4 MG disintegrating tablet Take 1 tablet (4 mg total) by mouth every 8 (eight) hours as needed for nausea or vomiting. 15 tablet 0 02/15/2016 at Unknown time  . Prenatal Vit-Fe Fumarate-FA (PRENATAL MULTIVITAMIN) TABS tablet Take 1 tablet by mouth daily at 12 noon.   02/16/2016 at Unknown time  . albuterol (PROVENTIL HFA;VENTOLIN HFA) 108 (90 Base) MCG/ACT inhaler Inhale 2 puffs into the lungs every 6 (six) hours as needed for wheezing or shortness of breath.   Rescue  . albuterol (PROVENTIL) (2.5 MG/3ML) 0.083% nebulizer solution Take 2.5 mg by nebulization every 6 (six) hours as needed for wheezing or shortness of breath.   Rescue    Review of Systems  Constitutional: Negative for fever.  HENT: Negative for sinus pain and sore throat.   Respiratory: Negative for chest tightness, shortness of breath and wheezing.   Cardiovascular: Negative for chest pain, palpitations and leg swelling.  Gastrointestinal: Negative for constipation, diarrhea, nausea and vomiting.  Genitourinary: Negative for dysuria.  Musculoskeletal: Negative for back pain.  Neurological: Positive for headaches. Negative for tremors, seizures, weakness and numbness.  Psychiatric/Behavioral: Negative for confusion.   Physical Exam   Blood pressure 105/58, pulse 110,  temperature 98 F (36.7 C), temperature source Oral, resp. rate 18.  Physical Exam  Constitutional: She is oriented to person, place, and time. She appears well-developed and well-nourished.  HENT:  Head: Normocephalic and atraumatic.  Right Ear: External ear normal.  Left Ear: External ear normal.  Nose: Nose normal.  Mouth/Throat: Oropharynx is clear and moist.  Eyes: Conjunctivae and EOM are normal. Pupils are equal, round, and reactive to light.  Neck: Normal range of motion.  No nuchal rigidity or cervical tenderness  Cardiovascular: Normal rate, normal heart sounds and intact distal pulses.    No murmur heard. Respiratory: Effort normal. No respiratory distress.  GI: Soft. She exhibits distension (gravid abdomen). There is no tenderness.  Musculoskeletal: Normal range of motion. She exhibits no edema.  Neurological: She is alert and oriented to person, place, and time. She has normal reflexes. No cranial nerve deficit. She exhibits normal muscle tone. Coordination normal.  Skin: Skin is warm and dry. No rash noted.    MAU Course  Procedures  MDM Patient presented with a migraine, no neurological deficits on exam. Vitals normal. No signs of SAH or meningitis. Migraine cocktail given (Reglan, Decadron, and benadryl) along with 500 cc NS bolus. Patient fell asleep in room when she awoke she said her HA resolved.   Assessment and Plan  Headache: Tension headache vs migraine headache.  - Advised Tylenol PRN - Rest and proper hydration - Return precautions and red flag symptoms discussed  Beaulah Dinning 02/16/2016, 10:59 PM    OB FELLOW MAU DISCHARGE ATTESTATION  I have seen and examined this patient; I agree with above documentation in the resident's note.   I personally reviewed the patient's NST today, found to be REACTIVE. 130 bpm, mod var, +accels, no decels. CTX: None   Jen Mow, DO OB Fellow 02/16/2016  11:34 PM

## 2016-02-16 NOTE — Progress Notes (Signed)
Notified of pt resting comfortably and stating headache is gone as well as IV being completed. MD will discharge pt home.

## 2016-02-16 NOTE — Discharge Instructions (Signed)
Come to the MAU (maternity admission unit) for 1) Strong contractions every 2-3 minutes for at least 1 hour that do no go away when you drink water or take a warm shower. These contractions will be so strong all you can do is breath through them 2) Vaginal bleeding- anything more than spotting 3) Loss of fluid like you broke your water 4) Decreased movement of your baby    General Headache Without Cause Introduction A headache is pain or discomfort felt around the head or neck area. There are many causes and types of headaches. In some cases, the cause may not be found. Follow these instructions at home: Managing pain  Take over-the-counter and prescription medicines only as told by your doctor.  Lie down in a dark, quiet room when you have a headache.  If directed, apply ice to the head and neck area:  Put ice in a plastic bag.  Place a towel between your skin and the bag.  Leave the ice on for 20 minutes, 2-3 times per day.  Use a heating pad or hot shower to apply heat to the head and neck area as told by your doctor.  Keep lights dim if bright lights bother you or make your headaches worse. Eating and drinking  Eat meals on a regular schedule.  Lessen how much alcohol you drink.  Lessen how much caffeine you drink, or stop drinking caffeine. General instructions  Keep all follow-up visits as told by your doctor. This is important.  Keep a journal to find out if certain things bring on headaches. For example, write down:  What you eat and drink.  How much sleep you get.  Any change to your diet or medicines.  Relax by getting a massage or doing other relaxing activities.  Lessen stress.  Sit up straight. Do not tighten (tense) your muscles.  Do not use tobacco products. This includes cigarettes, chewing tobacco, or e-cigarettes. If you need help quitting, ask your doctor.  Exercise regularly as told by your doctor.  Get enough sleep. This often means 7-9  hours of sleep. Contact a doctor if:  Your symptoms are not helped by medicine.  You have a headache that feels different than the other headaches.  You feel sick to your stomach (nauseous) or you throw up (vomit).  You have a fever. Get help right away if:  Your headache becomes really bad.  You keep throwing up.  You have a stiff neck.  You have trouble seeing.  You have trouble speaking.  You have pain in the eye or ear.  Your muscles are weak or you lose muscle control.  You lose your balance or have trouble walking.  You feel like you will pass out (faint) or you pass out.  You have confusion. This information is not intended to replace advice given to you by your health care provider. Make sure you discuss any questions you have with your health care provider. Document Released: 11/04/2007 Document Revised: 07/03/2015 Document Reviewed: 05/20/2014  2017 Elsevier

## 2016-02-16 NOTE — MAU Note (Signed)
Pt presents complaining of headache and abdominal pain that started at 1600. States she went to work and was unable to stay because of the pain. Reports one episode of vomiting at work. Denies leaking or bleeding. Reports good fetal movement.

## 2016-02-16 NOTE — Progress Notes (Signed)
Notified of pt arrival in MAU and complaint. Will see pt

## 2016-02-17 DIAGNOSIS — Z3A33 33 weeks gestation of pregnancy: Secondary | ICD-10-CM | POA: Diagnosis not present

## 2016-02-17 DIAGNOSIS — G43909 Migraine, unspecified, not intractable, without status migrainosus: Secondary | ICD-10-CM

## 2016-02-26 ENCOUNTER — Encounter: Payer: Medicaid Other | Admitting: Family

## 2016-03-08 ENCOUNTER — Other Ambulatory Visit (HOSPITAL_COMMUNITY)
Admission: RE | Admit: 2016-03-08 | Discharge: 2016-03-08 | Disposition: A | Discharge status: Home / Self Care | Payer: Medicaid Other | Source: Ambulatory Visit | Attending: Family | Admitting: Family

## 2016-03-08 ENCOUNTER — Ambulatory Visit (INDEPENDENT_AMBULATORY_CARE_PROVIDER_SITE_OTHER): Payer: Medicaid Other | Admitting: Obstetrics and Gynecology

## 2016-03-08 VITALS — BP 114/76 | HR 104 | Wt 166.4 lb

## 2016-03-08 DIAGNOSIS — Z113 Encounter for screening for infections with a predominantly sexual mode of transmission: Secondary | ICD-10-CM | POA: Insufficient documentation

## 2016-03-08 DIAGNOSIS — Z3480 Encounter for supervision of other normal pregnancy, unspecified trimester: Secondary | ICD-10-CM

## 2016-03-08 DIAGNOSIS — Z3483 Encounter for supervision of other normal pregnancy, third trimester: Secondary | ICD-10-CM

## 2016-03-08 LAB — OB RESULTS CONSOLE GBS: STREP GROUP B AG: NEGATIVE

## 2016-03-08 NOTE — Progress Notes (Signed)
   PRENATAL VISIT NOTE  Subjective:  Yvonne Gentry is a 26 y.o. G3P2002 at 1733w0d being seen today for ongoing prenatal care.  She is currently monitored for the following issues for this low-risk pregnancy and has Supervision of other normal pregnancy, antepartum on her problem list.  Patient reports no complaints.  Contractions: Irregular. Vag. Bleeding: None.  Movement: Present. Denies leaking of fluid.   The following portions of the patient's history were reviewed and updated as appropriate: allergies, current medications, past family history, past medical history, past social history, past surgical history and problem list. Problem list updated.  Objective:   Vitals:   03/08/16 1555  BP: 114/76  Pulse: (!) 104  Weight: 166 lb 6.4 oz (75.5 kg)    Fetal Status: Fetal Heart Rate (bpm): 142   Movement: Present     General:  Alert, oriented and cooperative. Patient is in no acute distress.  Skin: Skin is warm and dry. No rash noted.   Cardiovascular: Normal heart rate noted  Respiratory: Normal respiratory effort, no problems with respiration noted  Abdomen: Soft, gravid, appropriate for gestational age. Pain/Pressure: Present     Pelvic:  Cervical exam deferred       1/T/H  Extremities: Normal range of motion.  Edema: Trace  Mental Status: Normal mood and affect. Normal behavior. Normal judgment and thought content.   Assessment and Plan:  Pregnancy: G3P2002 at 6433w0d  1. Supervision of other normal pregnancy, antepartum -36 wk cultures today -tubal papers - [facility]lam[facility] - Culture, beta strep (group b only)  Term labor symptoms and general obstetric precautions including but not limited to vaginal bleeding, contractions, leaking of fluid and fetal movement were reviewed in detail with the patient. Please refer to After Visit Summary for other counseling recommendations.  Return in about 1 week (around 03/15/2016) for LOB.   Lorne SkeensNicholas  Michael Schenk, MD  03/08/2016 [phone]

## 2016-03-08 NOTE — Patient Instructions (Signed)
Third Trimester of Pregnancy The third trimester is from week 29 through week 40 (months 7 through 9). The third trimester is a time when the unborn baby (fetus) is growing rapidly. At the end of the ninth month, the fetus is about 20 inches in length and weighs 6-10 pounds. Body changes during your third trimester Your body goes through many changes during pregnancy. The changes vary from woman to woman. During the third trimester:  Your weight will continue to increase. You can expect to gain 25-35 pounds (11-16 kg) by the end of the pregnancy.  You may begin to get stretch marks on your hips, abdomen, and breasts.  You may urinate more often because the fetus is moving lower into your pelvis and pressing on your bladder.  You may develop or continue to have heartburn. This is caused by increased hormones that slow down muscles in the digestive tract.  You may develop or continue to have constipation because increased hormones slow digestion and cause the muscles that push waste through your intestines to relax.  You may develop hemorrhoids. These are swollen veins (varicose veins) in the rectum that can itch or be painful.  You may develop swollen, bulging veins (varicose veins) in your legs.  You may have increased body aches in the pelvis, back, or thighs. This is due to weight gain and increased hormones that are relaxing your joints.  You may have changes in your hair. These can include thickening of your hair, rapid growth, and changes in texture. Some women also have hair loss during or after pregnancy, or hair that feels dry or thin. Your hair will most likely return to normal after your baby is born.  Your breasts will continue to grow and they will continue to become tender. A yellow fluid (colostrum) may leak from your breasts. This is the first milk you are producing for your baby.  Your belly button may stick out.  You may notice more swelling in your hands, face, or  ankles.  You may have increased tingling or numbness in your hands, arms, and legs. The skin on your belly may also feel numb.  You may feel short of breath because of your expanding uterus.  You may have more problems sleeping. This can be caused by the size of your belly, increased need to urinate, and an increase in your body's metabolism.  You may notice the fetus "dropping," or moving lower in your abdomen.  You may have increased vaginal discharge.  Your cervix becomes thin and soft (effaced) near your due date. What to expect at prenatal visits You will have prenatal exams every 2 weeks until week 36. Then you will have weekly prenatal exams. During a routine prenatal visit:  You will be weighed to make sure you and the fetus are growing normally.  Your blood pressure will be taken.  Your abdomen will be measured to track your baby's growth.  The fetal heartbeat will be listened to.  Any test results from the previous visit will be discussed.  You may have a cervical check near your due date to see if you have effaced. At around 36 weeks, your health care provider will check your cervix. At the same time, your health care provider will also perform a test on the secretions of the vaginal tissue. This test is to determine if a type of bacteria, Group B streptococcus, is present. Your health care provider will explain this further. Your health care provider may ask you:    What your birth plan is.  How you are feeling.  If you are feeling the baby move.  If you have had any abnormal symptoms, such as leaking fluid, bleeding, severe headaches, or abdominal cramping.  If you are using any tobacco products, including cigarettes, chewing tobacco, and electronic cigarettes.  If you have any questions. Other tests or screenings that may be performed during your third trimester include:  Blood tests that check for low iron levels (anemia).  Fetal testing to check the health,  activity level, and growth of the fetus. Testing is done if you have certain medical conditions or if there are problems during the pregnancy.  Nonstress test (NST). This test checks the health of your baby to make sure there are no signs of problems, such as the baby not getting enough oxygen. During this test, a belt is placed around your belly. The baby is made to move, and its heart rate is monitored during movement. What is false labor? False labor is a condition in which you feel small, irregular tightenings of the muscles in the womb (contractions) that eventually go away. These are called Braxton Hicks contractions. Contractions may last for hours, days, or even weeks before true labor sets in. If contractions come at regular intervals, become more frequent, increase in intensity, or become painful, you should see your health care provider. What are the signs of labor?  Abdominal cramps.  Regular contractions that start at 10 minutes apart and become stronger and more frequent with time.  Contractions that start on the top of the uterus and spread down to the lower abdomen and back.  Increased pelvic pressure and dull back pain.  A watery or bloody mucus discharge that comes from the vagina.  Leaking of amniotic fluid. This is also known as your "water breaking." It could be a slow trickle or a gush. Let your doctor know if it has a color or strange odor. If you have any of these signs, call your health care provider right away, even if it is before your due date. Follow these instructions at home: Eating and drinking  Continue to eat regular, healthy meals.  Do not eat:  Raw meat or meat spreads.  Unpasteurized milk or cheese.  Unpasteurized juice.  Store-made salad.  Refrigerated smoked seafood.  Hot dogs or deli meat, unless they are piping hot.  More than 6 ounces of albacore tuna a week.  Shark, swordfish, king mackerel, or tile fish.  Store-made salads.  Raw  sprouts, such as mung bean or alfalfa sprouts.  Take prenatal vitamins as told by your health care provider.  Take 1000 mg of calcium daily as told by your health care provider.  If you develop constipation:  Take over-the-counter or prescription medicines.  Drink enough fluid to keep your urine clear or pale yellow.  Eat foods that are high in fiber, such as fresh fruits and vegetables, whole grains, and beans.  Limit foods that are high in fat and processed sugars, such as fried and sweet foods. Activity  Exercise only as directed by your health care provider. Healthy pregnant women should aim for 2 hours and 30 minutes of moderate exercise per week. If you experience any pain or discomfort while exercising, stop.  Avoid heavy lifting.  Do not exercise in extreme heat or humidity, or at high altitudes.  Wear low-heel, comfortable shoes.  Practice good posture.  Do not travel far distances unless it is absolutely necessary and only with the approval   of your health care provider.  Wear your seat belt at all times while in a car, on a bus, or on a plane.  Take frequent breaks and rest with your legs elevated if you have leg cramps or low back pain.  Do not use hot tubs, steam rooms, or saunas.  You may continue to have sex unless your health care provider tells you otherwise. Lifestyle  Do not use any products that contain nicotine or tobacco, such as cigarettes and e-cigarettes. If you need help quitting, ask your health care provider.  Do not drink alcohol.  Do not use any medicinal herbs or unprescribed drugs. These chemicals affect the formation and growth of the baby.  If you develop varicose veins:  Wear support pantyhose or compression stockings as told by your healthcare provider.  Elevate your feet for 15 minutes, 3-4 times a day.  Wear a supportive maternity bra to help with breast tenderness. General instructions  Take over-the-counter and prescription  medicines only as told by your health care provider. There are medicines that are either safe or unsafe to take during pregnancy.  Take warm sitz baths to soothe any pain or discomfort caused by hemorrhoids. Use hemorrhoid cream or witch hazel if your health care provider approves.  Avoid cat litter boxes and soil used by cats. These carry germs that can cause birth defects in the baby. If you have a cat, ask someone to clean the litter box for you.  To prepare for the arrival of your baby:  Take prenatal classes to understand, practice, and ask questions about the labor and delivery.  Make a trial run to the hospital.  Visit the hospital and tour the maternity area.  Arrange for maternity or paternity leave through employers.  Arrange for family and friends to take care of pets while you are in the hospital.  Purchase a rear-facing car seat and make sure you know how to install it in your car.  Pack your hospital bag.  Prepare the baby's nursery. Make sure to remove all pillows and stuffed animals from the baby's crib to prevent suffocation.  Visit your dentist if you have not gone during your pregnancy. Use a soft toothbrush to brush your teeth and be gentle when you floss.  Keep all prenatal follow-up visits as told by your health care provider. This is important. Contact a health care provider if:  You are unsure if you are in labor or if your water has broken.  You become dizzy.  You have mild pelvic cramps, pelvic pressure, or nagging pain in your abdominal area.  You have lower back pain.  You have persistent nausea, vomiting, or diarrhea.  You have an unusual or bad smelling vaginal discharge.  You have pain when you urinate. Get help right away if:  You have a fever.  You are leaking fluid from your vagina.  You have spotting or bleeding from your vagina.  You have severe abdominal pain or cramping.  You have rapid weight loss or weight gain.  You have  shortness of breath with chest pain.  You notice sudden or extreme swelling of your face, hands, ankles, feet, or legs.  Your baby makes fewer than 10 movements in 2 hours.  You have severe headaches that do not go away with medicine.  You have vision changes. Summary  The third trimester is from week 29 through week 40, months 7 through 9. The third trimester is a time when the unborn baby (fetus)   is growing rapidly.  During the third trimester, your discomfort may increase as you and your baby continue to gain weight. You may have abdominal, leg, and back pain, sleeping problems, and an increased need to urinate.  During the third trimester your breasts will keep growing and they will continue to become tender. A yellow fluid (colostrum) may leak from your breasts. This is the first milk you are producing for your baby.  False labor is a condition in which you feel small, irregular tightenings of the muscles in the womb (contractions) that eventually go away. These are called Braxton Hicks contractions. Contractions may last for hours, days, or even weeks before true labor sets in.  Signs of labor can include: abdominal cramps; regular contractions that start at 10 minutes apart and become stronger and more frequent with time; watery or bloody mucus discharge that comes from the vagina; increased pelvic pressure and dull back pain; and leaking of amniotic fluid. This information is not intended to replace advice given to you by your health care provider. Make sure you discuss any questions you have with your health care provider. Document Released: 01/19/2001 Document Revised: 07/03/2015 Document Reviewed: 03/28/2012 [employer] Interactive Patient Education  2017 [employer]  

## 2016-03-09 ENCOUNTER — Encounter: Payer: Self-pay | Admitting: *Deleted

## 2016-03-09 LAB — GC/CHLAMYDIA PROBE AMP (~~LOC~~) NOT AT ARMC
Chlamydia: NEGATIVE
Neisseria Gonorrhea: NEGATIVE

## 2016-03-10 LAB — CULTURE, BETA STREP (GROUP B ONLY)

## 2016-03-13 ENCOUNTER — Encounter (HOSPITAL_COMMUNITY): Payer: Self-pay

## 2016-03-13 ENCOUNTER — Inpatient Hospital Stay (HOSPITAL_COMMUNITY)
Admission: AD | Admit: 2016-03-13 | Discharge: 2016-03-13 | Disposition: A | Discharge status: Home / Self Care | Payer: Medicaid Other | Source: Ambulatory Visit | Attending: Obstetrics & Gynecology | Admitting: Obstetrics & Gynecology

## 2016-03-13 DIAGNOSIS — Z3A37 37 weeks gestation of pregnancy: Secondary | ICD-10-CM | POA: Insufficient documentation

## 2016-03-13 DIAGNOSIS — O1203 Gestational edema, third trimester: Secondary | ICD-10-CM

## 2016-03-13 DIAGNOSIS — Z3493 Encounter for supervision of normal pregnancy, unspecified, third trimester: Secondary | ICD-10-CM | POA: Insufficient documentation

## 2016-03-13 LAB — URINALYSIS, ROUTINE W REFLEX MICROSCOPIC
Bilirubin Urine: NEGATIVE
Glucose, UA: NEGATIVE mg/dL
Hgb urine dipstick: NEGATIVE
KETONES UR: NEGATIVE mg/dL
LEUKOCYTES UA: NEGATIVE
NITRITE: NEGATIVE
PH: 8 (ref 5.0–8.0)
PROTEIN: NEGATIVE mg/dL
Specific Gravity, Urine: 1.01 (ref 1.005–1.030)

## 2016-03-13 LAB — AMNISURE RUPTURE OF MEMBRANE (ROM) NOT AT ARMC: Amnisure ROM: NEGATIVE

## 2016-03-13 NOTE — MAU Provider Note (Signed)
History   G3P2002 @ 36.5 wks in with c/o left ankle pain and swelling. States which ever side the baby lays on that side swells. Denies calf pain of upper extremity pain or swelling. States this has been going on for several weeks. States also had sm gush of fluid yesterday with no further leaking.  CSN: [id number]  Arrival date & time 03/13/16  1704   None     Chief Complaint  Patient presents with  . Leg Pain    HPI  Past Medical History:  Diagnosis Date  . Asthma   . Gallstones   . Pollen allergies     Past Surgical History:  Procedure Laterality Date  . NO PAST SURGERIES      Family History  Problem Relation Age of Onset  . Hypertension Father   . Diabetes Brother   . Cancer Mother     Social History  Substance Use Topics  . Smoking status: Never Smoker  . Smokeless tobacco: Never Used  . Alcohol use Yes     Comment: not since being pregnant    OB History    Gravida Para Term Preterm AB Living   3 2 2  0 0 2   SAB TAB Ectopic Multiple Live Births   0 0 0 0 2      Review of Systems  Constitutional: Negative.   HENT: Negative.   Eyes: Negative.   Respiratory: Negative.   Cardiovascular: Positive for leg swelling.  Gastrointestinal: Negative.   Endocrine: Negative.   Genitourinary: Negative.   Musculoskeletal: Negative.   Skin: Negative.   Neurological: Negative.   Hematological: Negative.   Psychiatric/Behavioral: Negative.     Allergies  Patient has no known allergies.  Home Medications    BP 117/85 (BP Location: Left Arm)   Pulse 108   Temp 98.1 F (36.7 C) (Oral)   Resp 18   Ht 5\' 5"  (1.651 m)   Wt 170 lb 6.4 oz (77.3 kg)   LMP  (Within Months)   SpO2 100%   BMI 28.36 kg/m   Physical Exam  Constitutional: She is oriented to person, place, and time. She appears well-developed and well-nourished.  HENT:  Head: Normocephalic.  Eyes: Pupils are equal, round, and reactive to light.  Neck: Normal range of motion.  Cardiovascular:  Normal rate, regular rhythm, normal heart sounds and intact distal pulses.   Pulmonary/Chest: Effort normal and breath sounds normal.  Abdominal: Soft. Bowel sounds are normal.  Musculoskeletal: Normal range of motion.  Neurological: She is alert and oriented to person, place, and time. She has normal reflexes.  Skin: Skin is warm and dry.  Psychiatric: She has a normal mood and affect. Her behavior is normal. Judgment and thought content normal.    MAU Course  Procedures (including critical care time)  Labs Reviewed  URINALYSIS, ROUTINE W REFLEX MICROSCOPIC   No results found.   No diagnosis found.    MDM  Sm amt swelling left foot and ankle, no redness, or pain with palpation. FHR pattern reassuring. Discussed home measures to help decrease swelling in feet. And to decrease sodium intake. D/c home

## 2016-03-13 NOTE — MAU Note (Signed)
Pt to MAU with complaint of left leg swelling and pain. States she had a HA today relieved by motrin. She also complains of LOF yesterday.

## 2016-03-18 ENCOUNTER — Ambulatory Visit (INDEPENDENT_AMBULATORY_CARE_PROVIDER_SITE_OTHER): Payer: Medicaid Other | Admitting: Obstetrics and Gynecology

## 2016-03-18 VITALS — BP 119/72 | HR 75 | Wt 167.7 lb

## 2016-03-18 DIAGNOSIS — Z3483 Encounter for supervision of other normal pregnancy, third trimester: Secondary | ICD-10-CM

## 2016-03-18 DIAGNOSIS — Z3480 Encounter for supervision of other normal pregnancy, unspecified trimester: Secondary | ICD-10-CM

## 2016-03-19 NOTE — Progress Notes (Signed)
   PRENATAL VISIT NOTE  Subjective:  Yvonne Gentry is a 26 y.o. G3P2002 at 6982w4d being seen today for ongoing prenatal care.  She is currently monitored for the following issues for this high-risk pregnancy and has Supervision of other normal pregnancy, antepartum on her problem list.  Patient reports no complaints.  Contractions: Irregular.  .  Movement: Present. Denies leaking of fluid.   The following portions of the patient's history were reviewed and updated as appropriate: allergies, current medications, past family history, past medical history, past social history, past surgical history and problem list. Problem list updated.  Objective:   Vitals:   03/18/16 1545  BP: 119/72  Pulse: 75  Weight: 167 lb 11.2 oz (76.1 kg)    Fetal Status: Fetal Heart Rate (bpm): 143   Movement: Present     General:  Alert, oriented and cooperative. Patient is in no acute distress.  Skin: Skin is warm and dry. No rash noted.   Cardiovascular: Normal heart rate noted  Respiratory: Normal respiratory effort, no problems with respiration noted  Abdomen: Soft, gravid, appropriate for gestational age. Pain/Pressure: Present     Pelvic:  Cervical exam deferred        Extremities: Normal range of motion.  Edema: Trace  Mental Status: Normal mood and affect. Normal behavior. Normal judgment and thought content.   Assessment and Plan:  Pregnancy: G3P2002 at 5382w4d  1. Supervision of other normal pregnancy, antepartum -no complaints -labor precautions given   Term labor symptoms and general obstetric precautions including but not limited to vaginal bleeding, contractions, leaking of fluid and fetal movement were reviewed in detail with the patient. Please refer to After Visit Summary for other counseling recommendations.  Return in about 1 week (around 03/25/2016) for LOB.   Lorne SkeensNicholas Michael Schenk, MD

## 2016-03-19 NOTE — Patient Instructions (Signed)
Third Trimester of Pregnancy The third trimester is from week 29 through week 40 (months 7 through 9). The third trimester is a time when the unborn baby (fetus) is growing rapidly. At the end of the ninth month, the fetus is about 20 inches in length and weighs 6-10 pounds. Body changes during your third trimester Your body goes through many changes during pregnancy. The changes vary from woman to woman. During the third trimester:  Your weight will continue to increase. You can expect to gain 25-35 pounds (11-16 kg) by the end of the pregnancy.  You may begin to get stretch marks on your hips, abdomen, and breasts.  You may urinate more often because the fetus is moving lower into your pelvis and pressing on your bladder.  You may develop or continue to have heartburn. This is caused by increased hormones that slow down muscles in the digestive tract.  You may develop or continue to have constipation because increased hormones slow digestion and cause the muscles that push waste through your intestines to relax.  You may develop hemorrhoids. These are swollen veins (varicose veins) in the rectum that can itch or be painful.  You may develop swollen, bulging veins (varicose veins) in your legs.  You may have increased body aches in the pelvis, back, or thighs. This is due to weight gain and increased hormones that are relaxing your joints.  You may have changes in your hair. These can include thickening of your hair, rapid growth, and changes in texture. Some women also have hair loss during or after pregnancy, or hair that feels dry or thin. Your hair will most likely return to normal after your baby is born.  Your breasts will continue to grow and they will continue to become tender. A yellow fluid (colostrum) may leak from your breasts. This is the first milk you are producing for your baby.  Your belly button may stick out.  You may notice more swelling in your hands, face, or  ankles.  You may have increased tingling or numbness in your hands, arms, and legs. The skin on your belly may also feel numb.  You may feel short of breath because of your expanding uterus.  You may have more problems sleeping. This can be caused by the size of your belly, increased need to urinate, and an increase in your body's metabolism.  You may notice the fetus "dropping," or moving lower in your abdomen.  You may have increased vaginal discharge.  Your cervix becomes thin and soft (effaced) near your due date. What to expect at prenatal visits You will have prenatal exams every 2 weeks until week 36. Then you will have weekly prenatal exams. During a routine prenatal visit:  You will be weighed to make sure you and the fetus are growing normally.  Your blood pressure will be taken.  Your abdomen will be measured to track your baby's growth.  The fetal heartbeat will be listened to.  Any test results from the previous visit will be discussed.  You may have a cervical check near your due date to see if you have effaced. At around 36 weeks, your health care provider will check your cervix. At the same time, your health care provider will also perform a test on the secretions of the vaginal tissue. This test is to determine if a type of bacteria, Group B streptococcus, is present. Your health care provider will explain this further. Your health care provider may ask you:    What your birth plan is.  How you are feeling.  If you are feeling the baby move.  If you have had any abnormal symptoms, such as leaking fluid, bleeding, severe headaches, or abdominal cramping.  If you are using any tobacco products, including cigarettes, chewing tobacco, and electronic cigarettes.  If you have any questions. Other tests or screenings that may be performed during your third trimester include:  Blood tests that check for low iron levels (anemia).  Fetal testing to check the health,  activity level, and growth of the fetus. Testing is done if you have certain medical conditions or if there are problems during the pregnancy.  Nonstress test (NST). This test checks the health of your baby to make sure there are no signs of problems, such as the baby not getting enough oxygen. During this test, a belt is placed around your belly. The baby is made to move, and its heart rate is monitored during movement. What is false labor? False labor is a condition in which you feel small, irregular tightenings of the muscles in the womb (contractions) that eventually go away. These are called Braxton Hicks contractions. Contractions may last for hours, days, or even weeks before true labor sets in. If contractions come at regular intervals, become more frequent, increase in intensity, or become painful, you should see your health care provider. What are the signs of labor?  Abdominal cramps.  Regular contractions that start at 10 minutes apart and become stronger and more frequent with time.  Contractions that start on the top of the uterus and spread down to the lower abdomen and back.  Increased pelvic pressure and dull back pain.  A watery or bloody mucus discharge that comes from the vagina.  Leaking of amniotic fluid. This is also known as your "water breaking." It could be a slow trickle or a gush. Let your doctor know if it has a color or strange odor. If you have any of these signs, call your health care provider right away, even if it is before your due date. Follow these instructions at home: Eating and drinking  Continue to eat regular, healthy meals.  Do not eat:  Raw meat or meat spreads.  Unpasteurized milk or cheese.  Unpasteurized juice.  Store-made salad.  Refrigerated smoked seafood.  Hot dogs or deli meat, unless they are piping hot.  More than 6 ounces of albacore tuna a week.  Shark, swordfish, king mackerel, or tile fish.  Store-made salads.  Raw  sprouts, such as mung bean or alfalfa sprouts.  Take prenatal vitamins as told by your health care provider.  Take 1000 mg of calcium daily as told by your health care provider.  If you develop constipation:  Take over-the-counter or prescription medicines.  Drink enough fluid to keep your urine clear or pale yellow.  Eat foods that are high in fiber, such as fresh fruits and vegetables, whole grains, and beans.  Limit foods that are high in fat and processed sugars, such as fried and sweet foods. Activity  Exercise only as directed by your health care provider. Healthy pregnant women should aim for 2 hours and 30 minutes of moderate exercise per week. If you experience any pain or discomfort while exercising, stop.  Avoid heavy lifting.  Do not exercise in extreme heat or humidity, or at high altitudes.  Wear low-heel, comfortable shoes.  Practice good posture.  Do not travel far distances unless it is absolutely necessary and only with the approval   of your health care provider.  Wear your seat belt at all times while in a car, on a bus, or on a plane.  Take frequent breaks and rest with your legs elevated if you have leg cramps or low back pain.  Do not use hot tubs, steam rooms, or saunas.  You may continue to have sex unless your health care provider tells you otherwise. Lifestyle  Do not use any products that contain nicotine or tobacco, such as cigarettes and e-cigarettes. If you need help quitting, ask your health care provider.  Do not drink alcohol.  Do not use any medicinal herbs or unprescribed drugs. These chemicals affect the formation and growth of the baby.  If you develop varicose veins:  Wear support pantyhose or compression stockings as told by your healthcare provider.  Elevate your feet for 15 minutes, 3-4 times a day.  Wear a supportive maternity bra to help with breast tenderness. General instructions  Take over-the-counter and prescription  medicines only as told by your health care provider. There are medicines that are either safe or unsafe to take during pregnancy.  Take warm sitz baths to soothe any pain or discomfort caused by hemorrhoids. Use hemorrhoid cream or witch hazel if your health care provider approves.  Avoid cat litter boxes and soil used by cats. These carry germs that can cause birth defects in the baby. If you have a cat, ask someone to clean the litter box for you.  To prepare for the arrival of your baby:  Take prenatal classes to understand, practice, and ask questions about the labor and delivery.  Make a trial run to the hospital.  Visit the hospital and tour the maternity area.  Arrange for maternity or paternity leave through employers.  Arrange for family and friends to take care of pets while you are in the hospital.  Purchase a rear-facing car seat and make sure you know how to install it in your car.  Pack your hospital bag.  Prepare the baby's nursery. Make sure to remove all pillows and stuffed animals from the baby's crib to prevent suffocation.  Visit your dentist if you have not gone during your pregnancy. Use a soft toothbrush to brush your teeth and be gentle when you floss.  Keep all prenatal follow-up visits as told by your health care provider. This is important. Contact a health care provider if:  You are unsure if you are in labor or if your water has broken.  You become dizzy.  You have mild pelvic cramps, pelvic pressure, or nagging pain in your abdominal area.  You have lower back pain.  You have persistent nausea, vomiting, or diarrhea.  You have an unusual or bad smelling vaginal discharge.  You have pain when you urinate. Get help right away if:  You have a fever.  You are leaking fluid from your vagina.  You have spotting or bleeding from your vagina.  You have severe abdominal pain or cramping.  You have rapid weight loss or weight gain.  You have  shortness of breath with chest pain.  You notice sudden or extreme swelling of your face, hands, ankles, feet, or legs.  Your baby makes fewer than 10 movements in 2 hours.  You have severe headaches that do not go away with medicine.  You have vision changes. Summary  The third trimester is from week 29 through week 40, months 7 through 9. The third trimester is a time when the unborn baby (fetus)   is growing rapidly.  During the third trimester, your discomfort may increase as you and your baby continue to gain weight. You may have abdominal, leg, and back pain, sleeping problems, and an increased need to urinate.  During the third trimester your breasts will keep growing and they will continue to become tender. A yellow fluid (colostrum) may leak from your breasts. This is the first milk you are producing for your baby.  False labor is a condition in which you feel small, irregular tightenings of the muscles in the womb (contractions) that eventually go away. These are called Braxton Hicks contractions. Contractions may last for hours, days, or even weeks before true labor sets in.  Signs of labor can include: abdominal cramps; regular contractions that start at 10 minutes apart and become stronger and more frequent with time; watery or bloody mucus discharge that comes from the vagina; increased pelvic pressure and dull back pain; and leaking of amniotic fluid. This information is not intended to replace advice given to you by your health care provider. Make sure you discuss any questions you have with your health care provider. Document Released: 01/19/2001 Document Revised: 07/03/2015 Document Reviewed: 03/28/2012 [employer] Interactive Patient Education  2017 [employer]  

## 2016-03-24 ENCOUNTER — Ambulatory Visit (INDEPENDENT_AMBULATORY_CARE_PROVIDER_SITE_OTHER): Payer: Medicaid Other | Admitting: Obstetrics and Gynecology

## 2016-03-24 VITALS — BP 129/68 | HR 87 | Wt 168.1 lb

## 2016-03-24 DIAGNOSIS — Z3480 Encounter for supervision of other normal pregnancy, unspecified trimester: Secondary | ICD-10-CM

## 2016-03-24 DIAGNOSIS — Z3483 Encounter for supervision of other normal pregnancy, third trimester: Secondary | ICD-10-CM | POA: Diagnosis not present

## 2016-03-24 NOTE — Progress Notes (Signed)
Prenatal Visit Note Date: 03/24/2016 Clinic: [facility]  Subjective:  Yvonne Gentry is a 26 y.o. [id number] at 6818w2d being seen today for ongoing prenatal care.  She is currently monitored for the following issues for this low-risk pregnancy and has Supervision of other normal pregnancy, antepartum on her problem list.  Patient reports no complaints.   Contractions: Irregular. Vag. Bleeding: None.  Movement: Present. Denies leaking of fluid.   The following portions of the patient's history were reviewed and updated as appropriate: allergies, current medications, past family history, past medical history, past social history, past surgical history and problem list. Problem list updated.  Objective:   Vitals:   03/24/16 1446  BP: 129/68  Pulse: 87  Weight: 168 lb 1.6 oz (76.2 kg)    Fetal Status: Fetal Heart Rate (bpm): 144 Fundal Height: 38 cm Movement: Present  Presentation: Vertex  General:  Alert, oriented and cooperative. Patient is in no acute distress.  Skin: Skin is warm and dry. No rash noted.   Cardiovascular: Normal heart rate noted  Respiratory: Normal respiratory effort, no problems with respiration noted  Abdomen: Soft, gravid, appropriate for gestational age. Pain/Pressure: Present     Pelvic:  Cervical exam deferred        Extremities: Normal range of motion.  Edema: Trace  Mental Status: Normal mood and affect. Normal behavior. Normal judgment and thought content.   Urinalysis:      Assessment and Plan:  Pregnancy: G3P2002 at 3618w2d  1. Supervision of other normal pregnancy, antepartum Routine care. Still desires BTL. Papers already signed  Term labor symptoms and general obstetric precautions including but not limited to vaginal bleeding, contractions, leaking of fluid and fetal movement were reviewed in detail with the patient. Please refer to After Visit Summary for other counseling recommendations.  Return in about 1 week (around  03/31/2016) for 7-10d rob.   Dilley Bingharlie Tequlia Gonsalves, MD

## 2016-03-27 ENCOUNTER — Inpatient Hospital Stay (HOSPITAL_COMMUNITY)
Admission: AD | Admit: 2016-03-27 | Discharge: 2016-03-30 | DRG: 775 | Disposition: A | Discharge status: Home / Self Care | Payer: Medicaid Other | Source: Ambulatory Visit | Attending: Obstetrics and Gynecology | Admitting: Obstetrics and Gynecology

## 2016-03-27 ENCOUNTER — Encounter (HOSPITAL_COMMUNITY): Payer: Self-pay | Admitting: Certified Nurse Midwife

## 2016-03-27 DIAGNOSIS — Z833 Family history of diabetes mellitus: Secondary | ICD-10-CM

## 2016-03-27 DIAGNOSIS — O368131 Decreased fetal movements, third trimester, fetus 1: Secondary | ICD-10-CM

## 2016-03-27 DIAGNOSIS — Z8249 Family history of ischemic heart disease and other diseases of the circulatory system: Secondary | ICD-10-CM

## 2016-03-27 DIAGNOSIS — O368130 Decreased fetal movements, third trimester, not applicable or unspecified: Principal | ICD-10-CM | POA: Diagnosis present

## 2016-03-27 DIAGNOSIS — Z3A38 38 weeks gestation of pregnancy: Secondary | ICD-10-CM

## 2016-03-27 NOTE — MAU Note (Signed)
Patient presents to MAU with complaints of contractions and decreased FM. Both began at 1900 today. States she feels movement but not as much as usual. Denies LOF and bleeding.

## 2016-03-28 ENCOUNTER — Encounter (HOSPITAL_COMMUNITY): Payer: Self-pay

## 2016-03-28 DIAGNOSIS — Z833 Family history of diabetes mellitus: Secondary | ICD-10-CM | POA: Diagnosis not present

## 2016-03-28 DIAGNOSIS — O368130 Decreased fetal movements, third trimester, not applicable or unspecified: Secondary | ICD-10-CM | POA: Diagnosis present

## 2016-03-28 DIAGNOSIS — Z8249 Family history of ischemic heart disease and other diseases of the circulatory system: Secondary | ICD-10-CM | POA: Diagnosis not present

## 2016-03-28 DIAGNOSIS — Z3A38 38 weeks gestation of pregnancy: Secondary | ICD-10-CM | POA: Diagnosis not present

## 2016-03-28 LAB — CBC
HCT: 31.2 % — ABNORMAL LOW (ref 36.0–46.0)
HEMOGLOBIN: 10.9 g/dL — AB (ref 12.0–15.0)
MCH: 29.7 pg (ref 26.0–34.0)
MCHC: 34.9 g/dL (ref 30.0–36.0)
MCV: 85 fL (ref 78.0–100.0)
PLATELETS: 205 10*3/uL (ref 150–400)
RBC: 3.67 MIL/uL — ABNORMAL LOW (ref 3.87–5.11)
RDW: 13 % (ref 11.5–15.5)
WBC: 7.8 10*3/uL (ref 4.0–10.5)

## 2016-03-28 LAB — TYPE AND SCREEN
ABO/RH(D): O POS
Antibody Screen: NEGATIVE

## 2016-03-28 LAB — RPR: RPR Ser Ql: NONREACTIVE

## 2016-03-28 LAB — ABO/RH: ABO/RH(D): O POS

## 2016-03-28 MED ORDER — ACETAMINOPHEN 325 MG PO TABS
650.0000 mg | ORAL_TABLET | ORAL | Status: DC | PRN
  Administered 2016-03-28: 650 mg via ORAL
  Filled 2016-03-28: qty 2

## 2016-03-28 MED ORDER — SIMETHICONE 80 MG PO CHEW: 80.0000 mg | CHEWABLE_TABLET | ORAL | Status: DC | PRN

## 2016-03-28 MED ORDER — ALBUTEROL SULFATE HFA 108 (90 BASE) MCG/ACT IN AERS: 2.0000 | INHALATION_SPRAY | Freq: Four times a day (QID) | RESPIRATORY_TRACT | Status: DC | PRN

## 2016-03-28 MED ORDER — OXYTOCIN 40 UNITS IN LACTATED RINGERS INFUSION - SIMPLE MED
2.5000 [IU]/h | INTRAVENOUS | Status: DC
  Filled 2016-03-28: qty 1000

## 2016-03-28 MED ORDER — TETANUS-DIPHTH-ACELL PERTUSSIS 5-2.5-18.5 LF-MCG/0.5 IM SUSP: 0.5000 mL | Freq: Once | INTRAMUSCULAR | Status: DC

## 2016-03-28 MED ORDER — OXYCODONE HCL 5 MG PO TABS
5.0000 mg | ORAL_TABLET | ORAL | Status: DC | PRN
  Administered 2016-03-29: 5 mg via ORAL
  Filled 2016-03-28: qty 1

## 2016-03-28 MED ORDER — DIPHENHYDRAMINE HCL 25 MG PO CAPS: 25.0000 mg | ORAL_CAPSULE | Freq: Four times a day (QID) | ORAL | Status: DC | PRN

## 2016-03-28 MED ORDER — ZOLPIDEM TARTRATE 5 MG PO TABS: 5.0000 mg | ORAL_TABLET | Freq: Every evening | ORAL | Status: DC | PRN

## 2016-03-28 MED ORDER — IBUPROFEN 600 MG PO TABS
600.0000 mg | ORAL_TABLET | Freq: Four times a day (QID) | ORAL | Status: DC
  Administered 2016-03-28 – 2016-03-30 (×8): 600 mg via ORAL
  Filled 2016-03-28 (×8): qty 1

## 2016-03-28 MED ORDER — ONDANSETRON HCL 4 MG PO TABS: 4.0000 mg | ORAL_TABLET | ORAL | Status: DC | PRN

## 2016-03-28 MED ORDER — COCONUT OIL OIL
1.0000 "application " | TOPICAL_OIL | Status: DC | PRN
  Administered 2016-03-29: 1 via TOPICAL
  Filled 2016-03-28: qty 120

## 2016-03-28 MED ORDER — PRENATAL MULTIVITAMIN CH
1.0000 | ORAL_TABLET | Freq: Every day | ORAL | Status: DC
  Administered 2016-03-29: 1 via ORAL

## 2016-03-28 MED ORDER — OXYCODONE-ACETAMINOPHEN 5-325 MG PO TABS: 1.0000 | ORAL_TABLET | ORAL | Status: DC | PRN

## 2016-03-28 MED ORDER — ONDANSETRON HCL 4 MG/2ML IJ SOLN: 4.0000 mg | Freq: Four times a day (QID) | INTRAMUSCULAR | Status: DC | PRN

## 2016-03-28 MED ORDER — OXYCODONE-ACETAMINOPHEN 5-325 MG PO TABS: 2.0000 | ORAL_TABLET | ORAL | Status: DC | PRN

## 2016-03-28 MED ORDER — FERROUS SULFATE 325 (65 FE) MG PO TABS
325.0000 mg | ORAL_TABLET | Freq: Every day | ORAL | Status: DC
  Administered 2016-03-29: 325 mg via ORAL
  Filled 2016-03-28: qty 1

## 2016-03-28 MED ORDER — ALBUTEROL SULFATE (2.5 MG/3ML) 0.083% IN NEBU
2.5000 mg | INHALATION_SOLUTION | Freq: Four times a day (QID) | RESPIRATORY_TRACT | Status: DC | PRN
Start: 2016-03-28 — End: 2016-03-30

## 2016-03-28 MED ORDER — WITCH HAZEL-GLYCERIN EX PADS: 1.0000 "application " | MEDICATED_PAD | CUTANEOUS | Status: DC | PRN

## 2016-03-28 MED ORDER — FLEET ENEMA 7-19 GM/118ML RE ENEM: 1.0000 | ENEMA | RECTAL | Status: DC | PRN

## 2016-03-28 MED ORDER — OXYTOCIN BOLUS FROM INFUSION
500.0000 mL | Freq: Once | INTRAVENOUS | Status: AC
  Administered 2016-03-28: 500 mL via INTRAVENOUS

## 2016-03-28 MED ORDER — ONDANSETRON HCL 4 MG/2ML IJ SOLN: 4.0000 mg | INTRAMUSCULAR | Status: DC | PRN

## 2016-03-28 MED ORDER — PRENATAL MULTIVITAMIN CH
1.0000 | ORAL_TABLET | Freq: Every day | ORAL | Status: DC
  Filled 2016-03-28: qty 1

## 2016-03-28 MED ORDER — SENNOSIDES-DOCUSATE SODIUM 8.6-50 MG PO TABS
2.0000 | ORAL_TABLET | ORAL | Status: DC
  Administered 2016-03-28 – 2016-03-29 (×2): 2 via ORAL
  Filled 2016-03-28 (×2): qty 2

## 2016-03-28 MED ORDER — LIDOCAINE HCL (PF) 1 % IJ SOLN
30.0000 mL | INTRAMUSCULAR | Status: DC | PRN
  Filled 2016-03-28: qty 30

## 2016-03-28 MED ORDER — FENTANYL CITRATE (PF) 100 MCG/2ML IJ SOLN: 50.0000 ug | INTRAMUSCULAR | Status: DC | PRN

## 2016-03-28 MED ORDER — DIBUCAINE 1 % RE OINT: 1.0000 "application " | TOPICAL_OINTMENT | RECTAL | Status: DC | PRN

## 2016-03-28 MED ORDER — SOD CITRATE-CITRIC ACID 500-334 MG/5ML PO SOLN: 30.0000 mL | ORAL | Status: DC | PRN

## 2016-03-28 MED ORDER — LACTATED RINGERS IV SOLN: 500.0000 mL | INTRAVENOUS | Status: DC | PRN

## 2016-03-28 MED ORDER — BENZOCAINE-MENTHOL 20-0.5 % EX AERO: 1.0000 "application " | INHALATION_SPRAY | CUTANEOUS | Status: DC | PRN

## 2016-03-28 MED ORDER — LACTATED RINGERS IV SOLN: INTRAVENOUS | Status: DC

## 2016-03-28 NOTE — Progress Notes (Signed)
Patient ID: Yvonne Gentry, female   DOB: May 28, 1990, 26 y.o.   MRN: [id number] Yvonne Gentry is a 26 y.o. G3P2002 at 4443w6d.  Subjective: Contractions have spaced out some, but are still very painful.   Objective: BP 121/78 (BP Location: Left Arm)   Pulse 81   Temp 98.5 F (36.9 C) (Oral)   Resp 18   Ht 5\' 5"  (1.651 m)   Wt 170 lb (77.1 kg)   LMP  (Within Months)   BMI 28.29 kg/m    FHT:  FHR: 145 bpm, variability: mod,  accelerations:  15x15,  decelerations:  none UC:   Q 2-5 minutes, strong Dilation: 6 Effacement (%): 70 Cervical Position: Middle Station: -2 Presentation: Vertex Exam by:: V.Aron Needles,Rn  Labs: Results for orders placed or performed during the hospital encounter of 03/27/16 (from the past 24 hour(s))  CBC     Status: Abnormal   Collection Time: 03/28/16  1:34 AM  Result Value Ref Range   WBC 7.8 4.0 - 10.5 K/uL   RBC 3.67 (L) 3.87 - 5.11 MIL/uL   Hemoglobin 10.9 (L) 12.0 - 15.0 g/dL   HCT 08.631.2 (L) 57.836.0 - 46.946.0 %   MCV 85.0 78.0 - 100.0 fL   MCH 29.7 26.0 - 34.0 pg   MCHC 34.9 30.0 - 36.0 g/dL   RDW 62.913.0 52.811.5 - 41.315.5 %   Platelets 205 150 - 400 K/uL  Type and screen [facility]     Status: None   Collection Time: 03/28/16  1:34 AM  Result Value Ref Range   ABO/RH(D) O POS    Antibody Screen NEG    Sample Expiration 03/31/2016     Assessment / Plan: 2543w6d week IUP Labor: Early-Active Fetal Wellbeing:  Category I Pain Control:  Comfort measures Anticipated MOD:  SVD Minimal progress since last exam. If no change at next exam may consider augmentation May Ambulate Saline lock  [place] Lalania Haseman, CNM 03/28/2016 3:33 AM

## 2016-03-28 NOTE — Anesthesia Pain Management Evaluation Note (Signed)
  [facility]NA Pain Management Visit Note  Patient: Yvonne Gentry, [age] y.o., female  "Hello I am a [facility] the [facility]. We have an anesthesia team available at all times to provide care throughout the hospital, including epidural management and anesthesia for C-section. I don't know your plan for the delivery whether it a natural birth, water birth, IV sedation, nitrous supplementation, doula or epidural, but we want to meet your pain goals."   1.Was your pain managed to your expectations on prior hospitalizations?   Yes   2.What is your expectation for pain management during this hospitalization?     IV pain meds  3.How can we help you reach that goal? unsure  Record the patient's initial score and the patient's pain goal.   Pain: 10  Pain Goal: 10 [facility] wants you to be able to say your pain was always managed very well.  Cephus ShellingBURGER,Shuntia Exton 03/28/2016

## 2016-03-28 NOTE — H&P (Signed)
HPI: [name] is a 26 y.o. year old 773P2002 female at 7158w6d weeks gestation who presents to MAU reporting labor decreased fetal mvmt. Her cervix changed from 4.5-5.5. FHR reactive. Denies LOF or VB.  [facility] from [place] [facility] Prenatal Labs  Dating  6 week US Blood type: --/--/O POS (07/06 0105)   Genetic Screen 1 Screen: negative Antibody:Negative (08/10 0000)  Anatomic US  Normal Rubella: Immune (08/10 0000)  GTT Early:   Third trimester: 104 RPR: Nonreactive (08/10 0000)   Flu vaccine   11/23/15 HBsAg: Negative (08/10 0000)   TDaP vaccine     12/10/15                             Rhogam:n/a HIV: Non Reactive (07/06 0105)   Baby Food   breast                                            GBS: Neg (For PCN allergy, check sensitivities)neg  Contraception  IUD vs PP BTL Pap:  Circumcision  yes   Pediatrician  [place] [facility]   Support Person  FOB: Yvonne Gentry     OB History    Gravida Para Term Preterm AB Living   3 2 2  0 0 2   SAB TAB Ectopic Multiple Live Births   0 0 0 0 2     Past Medical History:  Diagnosis Date  . Asthma   . Gallstones   . Pollen allergies    Past Surgical History:  Procedure Laterality Date  . NO PAST SURGERIES     Family History: family history includes Cancer in her mother; Diabetes in her brother; Hypertension in her father. Social History:  reports that she has never smoked. She has never used smokeless tobacco. She reports that she drinks alcohol. She reports that she does not use drugs.     Maternal Diabetes: No Genetic Screening: Normal Maternal Ultrasounds/Referrals: Normal Fetal Ultrasounds or other Referrals:  None Maternal Substance Abuse:  No Significant Maternal Medications:  None Significant Maternal Lab Results:  Lab values include: Group B Strep negative Other Comments:  None  Review of Systems  Constitutional: Negative for chills and fever.  Eyes: Negative for blurred vision.  Gastrointestinal: Positive for abdominal  pain.  Genitourinary:       Neg for LOF, VB.   Neurological: Negative for headaches.   Maternal Medical History:  Reason for admission: Contractions.   Fetal activity: Perceived fetal activity is normal.   Last perceived fetal movement was within the past hour.    Prenatal Complications - Diabetes: none.    Dilation: 5.5 Effacement (%): 70 Station: -2 Exam by:: V Rogers RN  Blood pressure 126/75, pulse 87, temperature 98.4 F (36.9 C), temperature source Oral, resp. rate 16, height 5\' 5"  (1.651 m), weight 170 lb (77.1 kg). Maternal Exam:  Uterine Assessment: Contraction strength is moderate.  Contraction frequency is regular.   Abdomen: Patient reports no abdominal tenderness. Estimated fetal weight is 7-8.   Fetal presentation: vertex  Introitus: Normal vulva. Normal vagina.  Pelvis: adequate for delivery.   Cervix: Cervix evaluated by digital exam.     Patient Vitals for the past 24 hrs:  BP Temp Temp src Pulse Resp Height Weight  03/27/16 2344 126/75 98.4 F (36.9 C) Oral 87  16 5\' 5"  (1.651 m) 170 lb (77.1 kg)    Physical Exam  Nursing note and vitals reviewed. Constitutional: She is oriented to person, place, and time. She appears well-developed and well-nourished. No distress.  Cardiovascular: Normal rate, regular rhythm and normal heart sounds.   Respiratory: Effort normal and breath sounds normal. No respiratory distress.  GI: Soft. There is no tenderness.  Genitourinary: Vagina normal.  Musculoskeletal: She exhibits no edema or tenderness.  Neurological: She is alert and oriented to person, place, and time. She has normal reflexes.  Skin: Skin is warm and dry.  Psychiatric: She has a normal mood and affect.    Prenatal labs: ABO, Rh: --/--/O POS (07/06 0105) Antibody: Negative (08/10 0000) Rubella: Immune (08/10 0000) RPR: Nonreactive (08/10 0000)  HBsAg: Negative (08/10 0000)  HIV: Non Reactive (07/06 0105)  GBS:   Neg   Assessment: 1. Labor:  Active 2. Fetal Wellbeing: Category I  3. Pain Control: None 4. GBS: neg 5. 38.6 week IUP 6. Decreased fetal mvmt w/ reactive FHR.   Plan:  1. Admit to BS per consult with MD 2. Routine L&D orders 3. Analgesia/anesthesia PRN   Dorathy Kinsman 03/28/2016, 1:38 AM

## 2016-03-28 NOTE — Lactation Note (Signed)
This note was copied from a baby's chart. Lactation Consultation Note Initial visit at 11 hours of age. Mom reports good feedings and denies pain with latching.  Mom reports baby prefers right breast as her older 2 children did.  LC encouraged mom to alternate breasts and call for assist as needed. [facility] resources given and discussed.  Encouraged to feed with early cues on demand.  Early newborn behavior discussed.  Hand expression reported by mom with colostrum visible.  Mom to call for assist as needed.     Patient Name: Yvonne Gentry YNWGN'FToday's Date: 03/28/2016 Reason for consult: Initial assessment   Maternal Data Has patient been taught Hand Expression?: Yes Does the patient have breastfeeding experience prior to this delivery?: Yes  Feeding Feeding Type: Breast Fed Length of feed: 10 min  LATCH Score/Interventions                Intervention(s): Breastfeeding basics reviewed     Lactation Tools Discussed/Used WIC Program: Yes   Consult Status Consult Status: Follow-up Date: 03/29/16 Follow-up type: In-patient    Shoptaw, Arvella MerlesJana Lynn 03/28/2016, 8:31 PM

## 2016-03-29 NOTE — Progress Notes (Signed)
Post Partum Day #2 Subjective: no complaints, up ad lib, voiding and tolerating PO  Objective: Blood pressure 110/79, pulse 70, temperature 98.6 F (37 C), temperature source Oral, resp. rate 18, height 5\' 5"  (1.651 m), weight 170 lb (77.1 kg), SpO2 98 %, unknown if currently breastfeeding.  Physical Exam:  General: alert, cooperative and no distress Lochia: appropriate Uterine Fundus: firm Incision: none DVT Evaluation: No evidence of DVT seen on physical exam. No cords or calf tenderness. No significant calf/ankle edema.   Recent Labs  03/28/16 0134  HGB 10.9*  HCT 31.2*    Assessment/Plan: Plan for discharge tomorrow, Breastfeeding and Contraception interval BTL   LOS: 1 day   Roe CoombsRachelle A Braden Cimo, CNM 03/29/2016, 7:45 AM

## 2016-03-29 NOTE — Progress Notes (Signed)
UR chart review completed.  

## 2016-03-29 NOTE — Lactation Note (Signed)
This note was copied from a baby's chart. Lactation Consultation Note  Patient Name: Yvonne Gentry BankKinyetta Lanter MWUXL'KToday's Date: 03/29/2016 Reason for consult: Follow-up assessment Baby at 33 hr of life. Upon entry baby was sleeping in Dad's arms. Mom reports bf is going well. She thinks baby has started cluster feeding. She reports her L nipple is sore but the R nipple feels "fine". RN to give coconut oil. Discussed baby behavior, feeding frequency, voids, wt loss, breast changes, and nipple care. Mom is aware of lactation services and support group. She will call for a lactation to observe a latch at the next feeding.    Maternal Data    Feeding Feeding Type: Breast Fed Length of feed: 15 min  LATCH Score/Interventions Latch: Grasps breast easily, tongue down, lips flanged, rhythmical sucking.  Audible Swallowing: A few with stimulation Intervention(s): Skin to skin;Hand expression  Type of Nipple: Everted at rest and after stimulation  Comfort (Breast/Nipple): Filling, red/small blisters or bruises, mild/mod discomfort  Problem noted: Mild/Moderate discomfort Interventions (Mild/moderate discomfort): Hand expression  Hold (Positioning): No assistance needed to correctly position infant at breast.  LATCH Score: 8  Lactation Tools Discussed/Used     Consult Status Consult Status: Follow-up Date: 03/30/16 Follow-up type: In-patient    Rulon Eisenmengerlizabeth E Kalup Jaquith 03/29/2016, 6:52 PM

## 2016-03-29 NOTE — Plan of Care (Signed)
Problem: Nutritional: Goal: Mothers verbalization of comfort with breastfeeding process will improve Outcome: Progressing Pt reports comfort with breastfeeding.  Pt denies difficulty latching. Pt reports some nipple soreness but declines intervention.  Encouraged Pt to hand express and apply EBM to nipples.

## 2016-03-30 MED ORDER — IBUPROFEN 600 MG PO TABS: 600.0000 mg | ORAL_TABLET | Freq: Four times a day (QID) | ORAL | 2 refills | Status: DC

## 2016-03-30 MED ORDER — SENNOSIDES-DOCUSATE SODIUM 8.6-50 MG PO TABS: 1.0000 | ORAL_TABLET | Freq: Every day | ORAL | 1 refills | Status: DC

## 2016-03-30 MED ORDER — OXYCODONE HCL 5 MG PO TABS: 5.0000 mg | ORAL_TABLET | ORAL | 0 refills | Status: DC | PRN

## 2016-03-30 NOTE — Discharge Summary (Signed)
OB Discharge Summary     Patient Name: Yvonne Gentry DOB: [birth year] MRN: [id number]  Date of admission: 03/27/2016 Delivering MD: Wyvonnia DuskyLAWSON, Yvonne D   Date of discharge: 03/30/2016  Admitting diagnosis: 38 weeks contractions decrease in baby movement Intrauterine pregnancy: 6073w6d     Secondary diagnosis:  Active Problems:   Indication for care in labor or delivery  Additional problems: none     Discharge diagnosis: Term Pregnancy Delivered                                                                                                Post partum procedures:none  Augmentation: AROM  Complications: None  Hospital course:  Onset of Labor With Vaginal Delivery     26 y.o. yo G3P3003 at 3373w6d was admitted in Active Labor on 03/27/2016. Patient had an uncomplicated labor course as follows:  Membrane Rupture Time/Date: 6:37 AM ,03/28/2016   Intrapartum Procedures: Episiotomy: None [1]                                         Lacerations:  None [1]  Patient had a delivery of a Viable infant. 03/28/2016  Information for the patient's newborn:  Yvonne Gentry, Boy Yvonne Gentry [[id number]][[id number]]  Delivery Method: Vaginal, Spontaneous Delivery (Filed from Delivery Summary)    [name] had an uncomplicated postpartum course.  She is ambulating, tolerating a regular diet, passing flatus, and urinating well. Patient is discharged home in stable condition on 03/30/16.   Physical exam  Vitals:   03/28/16 1733 03/29/16 0530 03/29/16 1820 03/30/16 0556  BP: 113/67 110/79 125/69 120/62  Pulse: 77 70 71 73  Resp: 16 18 18 18   Temp: 98.4 F (36.9 C) 98.6 F (37 C) 98 F (36.7 C) 98.2 F (36.8 C)  TempSrc: Oral Oral Oral Oral  SpO2:      Weight:      Height:       General: alert, cooperative and no distress Lochia: appropriate Uterine Fundus: firm Incision: N/A DVT Evaluation: No evidence of DVT seen on physical exam. No cords or calf tenderness. No significant calf/ankle edema. Labs: Lab Results   Component Value Date   WBC 7.8 03/28/2016   HGB 10.9 (L) 03/28/2016   HCT 31.2 (L) 03/28/2016   MCV 85.0 03/28/2016   PLT 205 03/28/2016   CMP Latest Ref Rng & Units 11/26/2015  Glucose 65 - 99 mg/dL 80  BUN 6 - 20 mg/dL 7  Creatinine 7.820.44 - 9.561.00 mg/dL 2.13(Y0.39(L)  Sodium 865135 - 784145 mmol/L 132(L)  Potassium 3.5 - 5.1 mmol/L 3.5  Chloride 101 - 111 mmol/L 103  CO2 22 - 32 mmol/L 23  Calcium 8.9 - 10.3 mg/dL 8.2(L)  Total Protein 6.5 - 8.1 g/dL 6.6  Total Bilirubin 0.3 - 1.2 mg/dL 0.4  Alkaline Phos 38 - 126 U/L 47  AST 15 - 41 U/L 14(L)  ALT 14 - 54 U/L 10(L)    Discharge instruction: per After Visit Summary and "Baby and Me Booklet".  After visit meds:  Allergies as of 03/30/2016   No Known Allergies     Medication List    STOP taking these medications   cyclobenzaprine 5 MG tablet Commonly known as:  FLEXERIL   NIFEdipine 10 MG capsule Commonly known as:  PROCARDIA   ondansetron 4 MG disintegrating tablet Commonly known as:  ZOFRAN ODT     TAKE these medications   albuterol (2.5 MG/3ML) 0.083% nebulizer solution Commonly known as:  PROVENTIL Take 2.5 mg by nebulization every 6 (six) hours as needed for wheezing or shortness of breath.   albuterol 108 (90 Base) MCG/ACT inhaler Commonly known as:  PROVENTIL HFA;VENTOLIN HFA Inhale 2 puffs into the lungs every 6 (six) hours as needed for wheezing or shortness of breath.   ferrous sulfate 325 (65 FE) MG tablet Take 325 mg by mouth daily with breakfast.   ibuprofen 600 MG tablet Commonly known as:  ADVIL,MOTRIN Take 1 tablet (600 mg total) by mouth every 6 (six) hours.   oxyCODONE 5 MG immediate release tablet Commonly known as:  Oxy IR/ROXICODONE Take 1 tablet (5 mg total) by mouth every 3 (three) hours as needed for severe pain.   prenatal multivitamin Tabs tablet Take 1 tablet by mouth daily at 12 noon.   senna-docusate 8.6-50 MG tablet Commonly known as:  Senokot-S Take 1 tablet by mouth at bedtime.        Diet: routine diet  Activity: Advance as tolerated. Pelvic rest for 6 weeks.   Outpatient follow up:6 weeks Follow up Appt:Future Appointments Date Time Provider Department Center  05/11/2016 10:00 AM Marny Lowenstein, PA-C [facility]   Follow up Visit:No Follow-up on file.  Postpartum contraception: Interval BTL planned.    Newborn Data: Live born female  Birth Weight: 8 lb 0.6 oz (3646 g) APGAR: 8, 9  Baby Feeding: Breast Disposition:home with mother   03/30/2016 Roe Coombs, CNM

## 2016-03-30 NOTE — Progress Notes (Signed)
Post Partum Day #2 Subjective: no complaints, up ad lib, voiding and tolerating PO  Objective: Blood pressure 120/62, pulse 73, temperature 98.2 F (36.8 C), temperature source Oral, resp. rate 18, height 5\' 5"  (1.651 m), weight 170 lb (77.1 kg), SpO2 98 %, unknown if currently breastfeeding.  Physical Exam:  General: alert, cooperative and no distress Lochia: appropriate Uterine Fundus: firm Incision: none DVT Evaluation: No evidence of DVT seen on physical exam. No cords or calf tenderness. No significant calf/ankle edema.   Recent Labs  03/28/16 0134  HGB 10.9*  HCT 31.2*    Assessment/Plan: Discharge home, Breastfeeding and Contraception interval BTL planned.   LOS: 2 days   Roe CoombsRachelle A Denney, CNM 03/30/2016, 7:29 AM

## 2016-03-30 NOTE — Lactation Note (Signed)
This note was copied from a baby's chart. Lactation Consultation Note  Patient Name: Yvonne Gentry ZOXWR'UToday's Date: 03/30/2016 Reason for consult: Follow-up assessment;Infant weight loss;Other (Comment) (8% weight loss, @ 40 hours - 8.6 )  Baby is 49 hours old and has been consistently breast feeding and per mom left nipple is tender, and the RN checked it last night and there was a small crack.  Mom already has coconut oil and is using it . LC assessed nipple with moms permission and noted the nipple to be erect, and no crack. Reviewed hand expressing and had mom apply EBM to nipple several times.. LC also instructed mom on the use shells , hand pump, #24 Flange a good fit for today, and #27 Flange given for when milk comes in due to the sore ness.  LC recommended today to feed on the right and and since the milk is in the baby will probably be ok with one breast and she can use the hand pump to pump the left . When the nipple feels better - breast massage, hand express, pre pump with hand pump if needed and latch with firm support, and don't allow the baby to latch on by nibbling onto the breast. LC stressed the importance of working with the baby to open wide and when latching breast compressions until swallows. Per mom uses the cradle position often - LC recommended until the baby develops stronger muscles in the neck to sue cross cradle to cradle with changing arm position.  Sore nipple and engorgement prevention and tx reviewed.  Per mom active with WIC - and will use them as a resource if needed. Mother informed of post-discharge support and given phone number to the lactation department, including services for phone call assistance; out-patient appointments; and breastfeeding support group. List of other breastfeeding resources in the community given in the handout. Encouraged mother to call for problems or concerns related to breastfeeding.   Maternal Data Has patient been taught Hand  Expression?: Yes  Feeding Feeding Type: Breast Fed Length of feed: 10 min  LATCH Score/Interventions                Intervention(s): Breastfeeding basics reviewed     Lactation Tools Discussed/Used Tools: Shells;Comfort gels;Pump;Flanges Flange Size: 27 (#24 Flange ok for today, #27 Flange for when the milk came in ) Shell Type: Inverted Breast pump type: Manual WIC Program: Yes Pump Review: Setup, frequency, and cleaning;Milk Storage Initiated by:: MAI  Date initiated:: 03/30/16   Consult Status Consult Status: Complete Date: 03/30/16    Kathrin GreathouseMargaret Ann [name] 03/30/2016, 10:34 AM

## 2016-04-01 ENCOUNTER — Encounter: Payer: Medicaid Other | Admitting: Advanced Practice Midwife

## 2016-05-11 ENCOUNTER — Ambulatory Visit (INDEPENDENT_AMBULATORY_CARE_PROVIDER_SITE_OTHER): Payer: Medicaid Other | Admitting: Medical

## 2016-05-11 NOTE — Progress Notes (Signed)
Subjective:     Yvonne Gentry is a 26 y.o. female who presents for a postpartum visit. She is 5 weeks postpartum following a spontaneous vaginal delivery. I have fully reviewed the prenatal and intrapartum course. The delivery was at 38 gestational weeks. Outcome: spontaneous vaginal delivery. Anesthesia: none. Postpartum course has been normal. Baby's course has been normal. Baby is feeding by breast. Bleeding staining only. Bowel function is normal. Bladder function is normal. Patient is not sexually active. Contraception method is tubal ligation. Postpartum depression screening: negative.  The following portions of the patient's history were reviewed and updated as appropriate: allergies, current medications, past family history, past medical history, past social history, past surgical history and problem list.  Review of Systems Pertinent items are noted in HPI.   Objective:    BP 108/79   Pulse 89   Ht  (1.651 m)   Wt 143 lb 9.6 oz (65.1 kg)   LMP 05/09/2016 (Approximate)   Breastfeeding? Yes   BMI 23.90 kg/m   General:  alert and cooperative   Breasts:  not performed  Lungs: clear to auscultation bilaterally  Heart:  regular rate and rhythm, S1, S2 normal, no murmur, click, rub or gallop  Abdomen: soft, non-tender; bowel sounds normal; no masses,  no organomegaly   Vulva:  not evaluated  Vagina: not evaluated  Cervix:  not evaluated  Corpus: not examined  Adnexa:  not evaluated  Rectal Exam: Not performed.        Assessment:     Normal postpartum exam. Pap smear not done at today's visit.  Patient chooses to defer until after period.   Plan:    1. Contraception: abstinence, condom use advised until BTL 2. Message sent to schedule BTL. They will contact patient with surgery information  3. Follow up in: as needed or for post op appointment or as needed.    Marny Lowenstein, PA-C 05/11/2016 10:39 AM

## 2016-05-11 NOTE — Patient Instructions (Signed)
Laparoscopic Tubal Ligation Laparoscopic tubal ligation is a procedure to close the fallopian tubes. This is done so that you cannot get pregnant. When the fallopian tubes are closed, the eggs that your ovaries release cannot enter the uterus, and sperm cannot reach the released eggs. A laparoscopic tubal ligation is sometimes called "getting your tubes tied." You should not have this procedure if you want to get pregnant someday or if you are unsure about having more children. Tell a health care provider about:  Any allergies you have.  All medicines you are taking, including vitamins, herbs, eye drops, creams, and over-the-counter medicines.  Any problems you or family members have had with anesthetic medicines.  Any blood disorders you have.  Any surgeries you have had.  Any medical conditions you have.  Whether you are pregnant or may be pregnant.  Any past pregnancies. What are the risks? Generally, this is a safe procedure. However, problems may occur, including:  Infection.  Bleeding.  Injury to surrounding organs.  Side effects from anesthetics.  Failure of the procedure. This procedure can increase your risk of a kind of pregnancy in which a fertilized egg attaches to the outside of the uterus (ectopic pregnancy). What happens before the procedure?  Ask your health care provider about:  Changing or stopping your regular medicines. This is especially important if you are taking diabetes medicines or blood thinners.  Taking medicines such as aspirin and ibuprofen. These medicines can thin your blood. Do not take these medicines before your procedure if your health care provider instructs you not to.  Follow instructions from your health care provider about eating and drinking restrictions.  Plan to have someone take you home after the procedure.  If you go home right after the procedure, plan to have someone with you for 24 hours. What happens during the  procedure?  You will be given one or more of the following:  A medicine to help you relax (sedative).  A medicine to numb the area (local anesthetic).  A medicine to make you fall asleep (general anesthetic).  A medicine that is injected into an area of your body to numb everything below the injection site (regional anesthetic).  An IV tube will be inserted into one of your veins. It will be used to give you medicines and fluids during the procedure.  Your bladder may be emptied with a small tube (catheter).  If you have been given a general anesthetic, a tube will be put down your throat to help you breathe.  Two small cuts (incisions) will be made in your lower abdomen and near your belly button.  Your abdomen will be inflated with a gas. This will let the surgeon see better and will give the surgeon room to work.  A thin, lighted tube (laparoscope) with a camera attached will be inserted into your abdomen through one of the incisions. Small instruments will be inserted through the other incision.  The fallopian tubes will be tied off, burned (cauterized), or blocked with a clip, ring, or clamp. A small portion in the center of each fallopian tube may be removed.  The gas will be released from the abdomen.  The incisions will be closed with stitches (sutures).  A bandage (dressing) will be placed over the incisions. The procedure may vary among health care providers and hospitals. What happens after the procedure?  Your blood pressure, heart rate, breathing rate, and blood oxygen level will be monitored often until the medicines you were   given have worn off.  You will be given medicine to help with pain, nausea, and vomiting as needed. This information is not intended to replace advice given to you by your health care provider. Make sure you discuss any questions you have with your health care provider. Document Released: 05/03/2000 Document Revised: 07/03/2015 Document  Reviewed: 01/05/2015 Elsevier Interactive Patient Education  2017 [employer] Inc[employer]  

## 2016-05-31 ENCOUNTER — Ambulatory Visit (INDEPENDENT_AMBULATORY_CARE_PROVIDER_SITE_OTHER): Payer: Medicaid Other | Admitting: Obstetrics and Gynecology

## 2016-05-31 DIAGNOSIS — Z3009 Encounter for other general counseling and advice on contraception: Secondary | ICD-10-CM | POA: Insufficient documentation

## 2016-05-31 DIAGNOSIS — Z3042 Encounter for surveillance of injectable contraceptive: Secondary | ICD-10-CM

## 2016-05-31 DIAGNOSIS — Z3202 Encounter for pregnancy test, result negative: Secondary | ICD-10-CM

## 2016-05-31 DIAGNOSIS — Z308 Encounter for other contraceptive management: Secondary | ICD-10-CM

## 2016-05-31 LAB — POCT PREGNANCY, URINE: PREG TEST UR: NEGATIVE

## 2016-05-31 MED ORDER — MEDROXYPROGESTERONE ACETATE 150 MG/ML IM SUSP
150.0000 mg | Freq: Once | INTRAMUSCULAR | Status: AC
  Administered 2016-05-31: 150 mg via INTRAMUSCULAR

## 2016-05-31 NOTE — Patient Instructions (Signed)
Laparoscopic Tubal Ligation Laparoscopic tubal ligation is a procedure to close the fallopian tubes. This is done so that you cannot get pregnant. When the fallopian tubes are closed, the eggs that your ovaries release cannot enter the uterus, and sperm cannot reach the released eggs. A laparoscopic tubal ligation is sometimes called "getting your tubes tied." You should not have this procedure if you want to get pregnant someday or if you are unsure about having more children. Tell a health care provider about:  Any allergies you have.  All medicines you are taking, including vitamins, herbs, eye drops, creams, and over-the-counter medicines.  Any problems you or family members have had with anesthetic medicines.  Any blood disorders you have.  Any surgeries you have had.  Any medical conditions you have.  Whether you are pregnant or may be pregnant.  Any past pregnancies. What are the risks? Generally, this is a safe procedure. However, problems may occur, including:  Infection.  Bleeding.  Injury to surrounding organs.  Side effects from anesthetics.  Failure of the procedure. This procedure can increase your risk of a kind of pregnancy in which a fertilized egg attaches to the outside of the uterus (ectopic pregnancy). What happens before the procedure?  Ask your health care provider about:  Changing or stopping your regular medicines. This is especially important if you are taking diabetes medicines or blood thinners.  Taking medicines such as aspirin and ibuprofen. These medicines can thin your blood. Do not take these medicines before your procedure if your health care provider instructs you not to.  Follow instructions from your health care provider about eating and drinking restrictions.  Plan to have someone take you home after the procedure.  If you go home right after the procedure, plan to have someone with you for 24 hours. What happens during the  procedure?  You will be given one or more of the following:  A medicine to help you relax (sedative).  A medicine to numb the area (local anesthetic).  A medicine to make you fall asleep (general anesthetic).  A medicine that is injected into an area of your body to numb everything below the injection site (regional anesthetic).  An IV tube will be inserted into one of your veins. It will be used to give you medicines and fluids during the procedure.  Your bladder may be emptied with a small tube (catheter).  If you have been given a general anesthetic, a tube will be put down your throat to help you breathe.  Two small cuts (incisions) will be made in your lower abdomen and near your belly button.  Your abdomen will be inflated with a gas. This will let the surgeon see better and will give the surgeon room to work.  A thin, lighted tube (laparoscope) with a camera attached will be inserted into your abdomen through one of the incisions. Small instruments will be inserted through the other incision.  The fallopian tubes will be tied off, burned (cauterized), or blocked with a clip, ring, or clamp. A small portion in the center of each fallopian tube may be removed.  The gas will be released from the abdomen.  The incisions will be closed with stitches (sutures).  A bandage (dressing) will be placed over the incisions. The procedure may vary among health care providers and hospitals. What happens after the procedure?  Your blood pressure, heart rate, breathing rate, and blood oxygen level will be monitored often until the medicines you were   given have worn off.  You will be given medicine to help with pain, nausea, and vomiting as needed. This information is not intended to replace advice given to you by your health care provider. Make sure you discuss any questions you have with your health care provider. Document Released: 05/03/2000 Document Revised: 07/03/2015 Document  Reviewed: 01/05/2015 Elsevier Interactive Patient Education  2017 [employer] Inc. What You Need to Know About Female Sterilization Female sterilization is surgery to prevent pregnancy. In this surgery, the fallopian tubes are either blocked or closed off. This prevents eggs from reaching the uterus so that the eggs cannot be fertilized by sperm and you cannot get pregnant. Sterilization is permanent. It should only be done if you are sure that you do not want to be able to have children. What are the sterilization surgery options? There are several kinds of female sterilization surgeries. They include:  Laparoscopic tubal ligation. In this surgery, the fallopian tubes are tied off, sealed with heat, or blocked with a clip, ring, or clamp. A small portion of each fallopian tube may also be removed. This surgery is done through several small cuts (incisions).  Postpartum tubal ligation. This is also called a mini-laparotomy. This surgery is done right after childbirth or 1 or 2 days after childbirth. In this surgery, the fallopian tubes are tied off, sealed with heat, or blocked with a clip, ring, or clamp. A small portion of each fallopian tube may also be removed. The surgery is done through a single incision.  Hysteroscopic sterilization. In this surgery, a tiny, spring-like coil is inserted through the cervix and uterus into the fallopian tubes. The coil causes scarring, which blocks the tubes. After the surgery, contraception should be used for 3 months to allow the scar tissue to form completely. Is sterilization safe? Generally, sterilization is safe. Complications are rare. However, there are risks. They include:  Bleeding.  Infection.  Reaction to medicine used during the procedure.  Injury to surrounding organs.  Failure of the procedure. How effective is sterilization? Sterilization is nearly 100% effective, but it can fail. Also, the fallopian tubes can grow back together over  time. If this happens, you will be able to get pregnant again. Women who have had this procedure have a higher chance of having an ectopic pregnancy. An ectopic pregnancy is a pregnancy that happens outside of the uterus. This kind of pregnancy is unsuccessful and can lead to serious bleeding if it is not treated. What are the benefits?  It is usually effective for a lifetime.  It is usually safe.  It does not have the drawbacks of other types of birth control: That means:  Your hormones are not affected. Because of this, your menstrual periods, sexual desire, and sexual performance will not be affected.  There are no side effects. What are the drawbacks?  If you change your mind and decide that you want to have children, you may not be able to. Sterilization may be reversed, but a reversal is not always successful.  It does not provide protection against STDs (sexually transmitted diseases).  It increases the chance of having an ectopic pregnancy. This information is not intended to replace advice given to you by your health care provider. Make sure you discuss any questions you have with your health care provider. Document Released: 07/14/2007 Document Revised: 09/18/2015 Document Reviewed: 10/22/2014 Elsevier Interactive Patient Education  2017 [employer].

## 2016-06-01 ENCOUNTER — Encounter (HOSPITAL_COMMUNITY): Payer: Self-pay

## 2016-06-02 NOTE — Progress Notes (Signed)
Ms Sherrill presents for discussion of BTL. She is G3P3 and desires BTL She is currently no using any contraception. She was to have a PP BTL but there was a problem with her medicaid tubal papers She has signed new tubal papers today  PE AF VSS Lungs clear Heart RRR Abd soft +BS  A/P Unwanted fertility  Medical tubal papers signed today., Will have to wait 30 days. Pt informed Options for intervene contraception discussed. She desires Depo Provera. UOT today negative and shot given Lap BTL via coagulation reviewed with pt. R/B/Failure rate and post op care reviewed. She verbalized understanding and desires to proceed. Will schedule after 30 days. F/U with post op appt.

## 2016-06-30 NOTE — Patient Instructions (Signed)
Your procedure is scheduled on:  Monday, July 12, 2016  [facility]  [facility]00 AM  Pick up the phone at the desk and dial [phone].  Call this number if you have problems the morning of surgery: [phone].  Remember: Do NOT eat food or drink after:  Midnight Sunday  Take these medicines the morning of surgery with a SIP OF WATER:  None  Bring Asthma Inhaler day of surgery  Stop ALL herbal medications at this time  Do NOT smoke the day of surgery.  Do NOT wear jewelry (body piercing), metal hair clips/bobby pins, make-up, or nail polish. Do NOT wear lotions, powders, or perfumes.  You may wear deodorant. Do NOT shave for 48 hours prior to surgery. Do NOT bring valuables to the hospital. Contacts, dentures, or bridgework may not be worn into surgery.  Have a responsible adult drive you home and stay with you for 24 hours after your procedure  Bring a copy of your healthcare power of attorney and living will documents.

## 2016-07-01 ENCOUNTER — Encounter (HOSPITAL_COMMUNITY)
Admission: RE | Admit: 2016-07-01 | Discharge: 2016-07-01 | Disposition: A | Discharge status: Home / Self Care | Payer: Medicaid Other | Source: Ambulatory Visit | Attending: Obstetrics and Gynecology | Admitting: Obstetrics and Gynecology

## 2016-07-01 ENCOUNTER — Encounter (HOSPITAL_COMMUNITY): Payer: Self-pay

## 2016-07-01 DIAGNOSIS — Z01812 Encounter for preprocedural laboratory examination: Secondary | ICD-10-CM | POA: Insufficient documentation

## 2016-07-01 HISTORY — DX: Panic disorder (episodic paroxysmal anxiety): F41.0

## 2016-07-01 LAB — BASIC METABOLIC PANEL
Anion gap: 7 (ref 5–15)
BUN: 14 mg/dL (ref 6–20)
CO2: 27 mmol/L (ref 22–32)
CREATININE: 0.75 mg/dL (ref 0.44–1.00)
Calcium: 9.4 mg/dL (ref 8.9–10.3)
Chloride: 106 mmol/L (ref 101–111)
GLUCOSE: 101 mg/dL — AB (ref 65–99)
Potassium: 3.5 mmol/L (ref 3.5–5.1)
Sodium: 140 mmol/L (ref 135–145)

## 2016-07-01 LAB — CBC
HCT: 38.7 % (ref 36.0–46.0)
Hemoglobin: 13 g/dL (ref 12.0–15.0)
MCH: 28.5 pg (ref 26.0–34.0)
MCHC: 33.6 g/dL (ref 30.0–36.0)
MCV: 84.9 fL (ref 78.0–100.0)
PLATELETS: 266 10*3/uL (ref 150–400)
RBC: 4.56 MIL/uL (ref 3.87–5.11)
RDW: 12.7 % (ref 11.5–15.5)
WBC: 6.1 10*3/uL (ref 4.0–10.5)

## 2016-07-09 NOTE — H&P (Signed)
Yvonne PickleKinyetta N Gentry is an 26 y.o.G3P3 female who presents for laparoscopic BTL d/t unwanted fertility. She has signed medicaid tubal papers. Risk/failure rate and post op care have been reviewed with pt.   Menstrual History: Menarche age: [age] Patient's last menstrual period was 06/24/2016 (approximate).    Past Medical History:  Diagnosis Date  . Asthma   . Gallstones   . Panic attack   . Pollen allergies     Past Surgical History:  Procedure Laterality Date  . NO PAST SURGERIES      Family History  Problem Relation Age of Onset  . Hypertension Father   . Diabetes Brother   . Cancer Mother     Social History:  reports that she has never smoked. She has never used smokeless tobacco. She reports that she drinks alcohol. She reports that she does not use drugs.  Allergies: No Known Allergies  No prescriptions prior to admission.    Review of Systems  Constitutional: Negative.   Respiratory: Negative.   Cardiovascular: Negative.   Gastrointestinal: Negative.   Genitourinary: Negative.     Last menstrual period 06/24/2016, currently breastfeeding. Physical Exam  Constitutional: She appears well-developed and well-nourished.  Cardiovascular: Normal rate, regular rhythm and normal heart sounds.   Respiratory: Effort normal and breath sounds normal.  GI: Soft. Bowel sounds are normal.  Genitourinary:  Genitourinary Comments: Deferred to OR    No results found for this or any previous visit (from the past 24 hour(s)).  No results found.  Assessment/Plan: Unwanted fertility  Pt for laparoscopic BTL.   Hermina StaggersMichael L Giannina Bartolome 07/09/2016, 8:28 PM

## 2016-07-11 ENCOUNTER — Encounter (HOSPITAL_COMMUNITY): Payer: Self-pay | Admitting: Anesthesiology

## 2016-07-11 NOTE — Anesthesia Preprocedure Evaluation (Addendum)
Anesthesia Evaluation  Patient identified by MRN, date of birth, ID band Patient awake    Reviewed: Allergy & Precautions, NPO status , Patient's Chart, lab work & pertinent test results  Airway Mallampati: I  TM Distance: >3 FB Neck ROM: Full    Dental  (+) Teeth Intact, Dental Advisory Given   Pulmonary asthma ,    breath sounds clear to auscultation       Cardiovascular negative cardio ROS   Rhythm:Regular Rate:Normal     Neuro/Psych Anxiety negative neurological ROS     GI/Hepatic negative GI ROS, Neg liver ROS,   Endo/Other  negative endocrine ROS  Renal/GU negative Renal ROS  negative genitourinary   Musculoskeletal negative musculoskeletal ROS (+)   Abdominal   Peds negative pediatric ROS (+)  Hematology negative hematology ROS (+)   Anesthesia Other Findings Day of surgery medications reviewed with the patient.  Reproductive/Obstetrics negative OB ROS                            Anesthesia Physical Anesthesia Plan  ASA: II  Anesthesia Plan: General   Post-op Pain Management:    Induction: Intravenous  Airway Management Planned: Oral ETT  Additional Equipment:   Intra-op Plan:   Post-operative Plan: Extubation in OR  Informed Consent: I have reviewed the patients History and Physical, chart, labs and discussed the procedure including the risks, benefits and alternatives for the proposed anesthesia with the patient or authorized representative who has indicated his/her understanding and acceptance.     Plan Discussed with: CRNA  Anesthesia Plan Comments:         Anesthesia Quick Evaluation

## 2016-07-12 ENCOUNTER — Encounter (HOSPITAL_COMMUNITY): Payer: Self-pay

## 2016-07-12 ENCOUNTER — Encounter (HOSPITAL_COMMUNITY)
Admission: RE | Disposition: A | Discharge status: Home / Self Care | Payer: Self-pay | Source: Ambulatory Visit | Attending: Obstetrics and Gynecology

## 2016-07-12 ENCOUNTER — Ambulatory Visit (HOSPITAL_COMMUNITY)
Admission: RE | Admit: 2016-07-12 | Discharge: 2016-07-12 | Disposition: A | Discharge status: Home / Self Care | Payer: Medicaid Other | Source: Ambulatory Visit | Attending: Obstetrics and Gynecology | Admitting: Obstetrics and Gynecology

## 2016-07-12 ENCOUNTER — Ambulatory Visit (HOSPITAL_COMMUNITY): Payer: Medicaid Other | Admitting: Anesthesiology

## 2016-07-12 DIAGNOSIS — J45909 Unspecified asthma, uncomplicated: Secondary | ICD-10-CM | POA: Insufficient documentation

## 2016-07-12 DIAGNOSIS — Z302 Encounter for sterilization: Secondary | ICD-10-CM | POA: Diagnosis present

## 2016-07-12 DIAGNOSIS — F410 Panic disorder [episodic paroxysmal anxiety] without agoraphobia: Secondary | ICD-10-CM | POA: Insufficient documentation

## 2016-07-12 HISTORY — PX: LAPAROSCOPIC TUBAL LIGATION: SHX1937

## 2016-07-12 LAB — PREGNANCY, URINE: Preg Test, Ur: NEGATIVE

## 2016-07-12 SURGERY — LIGATION, FALLOPIAN TUBE, LAPAROSCOPIC
Anesthesia: General | Site: Abdomen | Laterality: Bilateral

## 2016-07-12 MED ORDER — FENTANYL CITRATE (PF) 100 MCG/2ML IJ SOLN
INTRAMUSCULAR | Status: AC
  Administered 2016-07-12: 50 ug via INTRAVENOUS
  Filled 2016-07-12: qty 2

## 2016-07-12 MED ORDER — KETOROLAC TROMETHAMINE 30 MG/ML IJ SOLN
INTRAMUSCULAR | Status: AC
  Filled 2016-07-12: qty 1

## 2016-07-12 MED ORDER — SUGAMMADEX SODIUM 200 MG/2ML IV SOLN
INTRAVENOUS | Status: AC
  Filled 2016-07-12: qty 2

## 2016-07-12 MED ORDER — LIDOCAINE HCL (CARDIAC) 20 MG/ML IV SOLN
INTRAVENOUS | Status: DC | PRN
  Administered 2016-07-12: 50 mg via INTRAVENOUS

## 2016-07-12 MED ORDER — OXYCODONE HCL 5 MG PO TABS: 5.0000 mg | ORAL_TABLET | Freq: Four times a day (QID) | ORAL | 0 refills | Status: DC | PRN

## 2016-07-12 MED ORDER — MIDAZOLAM HCL 2 MG/2ML IJ SOLN
INTRAMUSCULAR | Status: AC
  Filled 2016-07-12: qty 2

## 2016-07-12 MED ORDER — OXYCODONE HCL 5 MG/5ML PO SOLN: 5.0000 mg | Freq: Once | ORAL | Status: DC | PRN

## 2016-07-12 MED ORDER — ONDANSETRON HCL 4 MG/2ML IJ SOLN
INTRAMUSCULAR | Status: DC | PRN
  Administered 2016-07-12: 4 mg via INTRAVENOUS

## 2016-07-12 MED ORDER — OXYCODONE HCL 5 MG PO TABS: 5.0000 mg | ORAL_TABLET | Freq: Once | ORAL | Status: DC | PRN

## 2016-07-12 MED ORDER — PROPOFOL 10 MG/ML IV BOLUS
INTRAVENOUS | Status: DC | PRN
  Administered 2016-07-12: 150 mg via INTRAVENOUS

## 2016-07-12 MED ORDER — FENTANYL CITRATE (PF) 100 MCG/2ML IJ SOLN
25.0000 ug | INTRAMUSCULAR | Status: DC | PRN
  Administered 2016-07-12 (×2): 50 ug via INTRAVENOUS

## 2016-07-12 MED ORDER — PROPOFOL 10 MG/ML IV BOLUS
INTRAVENOUS | Status: AC
  Filled 2016-07-12: qty 20

## 2016-07-12 MED ORDER — LACTATED RINGERS IV SOLN
INTRAVENOUS | Status: DC
  Administered 2016-07-12 (×2): via INTRAVENOUS

## 2016-07-12 MED ORDER — DEXAMETHASONE SODIUM PHOSPHATE 10 MG/ML IJ SOLN
INTRAMUSCULAR | Status: DC | PRN
  Administered 2016-07-12: 4 mg via INTRAVENOUS

## 2016-07-12 MED ORDER — ROCURONIUM BROMIDE 100 MG/10ML IV SOLN
INTRAVENOUS | Status: DC | PRN
  Administered 2016-07-12: 35 mg via INTRAVENOUS

## 2016-07-12 MED ORDER — LACTATED RINGERS IV SOLN: INTRAVENOUS | Status: DC

## 2016-07-12 MED ORDER — SCOPOLAMINE 1 MG/3DAYS TD PT72
MEDICATED_PATCH | TRANSDERMAL | Status: AC
  Administered 2016-07-12: 1.5 mg via TRANSDERMAL
  Filled 2016-07-12: qty 1

## 2016-07-12 MED ORDER — IBUPROFEN 800 MG PO TABS: 800.0000 mg | ORAL_TABLET | Freq: Three times a day (TID) | ORAL | 0 refills | Status: DC | PRN

## 2016-07-12 MED ORDER — MIDAZOLAM HCL 2 MG/2ML IJ SOLN
INTRAMUSCULAR | Status: DC | PRN
  Administered 2016-07-12: 2 mg via INTRAVENOUS

## 2016-07-12 MED ORDER — LIDOCAINE HCL (CARDIAC) 20 MG/ML IV SOLN
INTRAVENOUS | Status: AC
  Filled 2016-07-12: qty 5

## 2016-07-12 MED ORDER — BUPIVACAINE HCL (PF) 0.5 % IJ SOLN
INTRAMUSCULAR | Status: AC
  Filled 2016-07-12: qty 30

## 2016-07-12 MED ORDER — MEPERIDINE HCL 25 MG/ML IJ SOLN: 6.2500 mg | INTRAMUSCULAR | Status: DC | PRN

## 2016-07-12 MED ORDER — FENTANYL CITRATE (PF) 250 MCG/5ML IJ SOLN
INTRAMUSCULAR | Status: AC
  Filled 2016-07-12: qty 5

## 2016-07-12 MED ORDER — PROMETHAZINE HCL 25 MG/ML IJ SOLN: 6.2500 mg | INTRAMUSCULAR | Status: DC | PRN

## 2016-07-12 MED ORDER — SUGAMMADEX SODIUM 200 MG/2ML IV SOLN
INTRAVENOUS | Status: DC | PRN
  Administered 2016-07-12: 200 mg via INTRAVENOUS

## 2016-07-12 MED ORDER — SCOPOLAMINE 1 MG/3DAYS TD PT72
1.0000 | MEDICATED_PATCH | Freq: Once | TRANSDERMAL | Status: DC
  Administered 2016-07-12: 1.5 mg via TRANSDERMAL

## 2016-07-12 MED ORDER — FENTANYL CITRATE (PF) 100 MCG/2ML IJ SOLN
INTRAMUSCULAR | Status: DC | PRN
  Administered 2016-07-12 (×3): 50 ug via INTRAVENOUS
  Administered 2016-07-12: 100 ug via INTRAVENOUS

## 2016-07-12 MED ORDER — SOD CITRATE-CITRIC ACID 500-334 MG/5ML PO SOLN
ORAL | Status: AC
  Administered 2016-07-12: 30 mL via ORAL
  Filled 2016-07-12: qty 15

## 2016-07-12 MED ORDER — BUPIVACAINE HCL 0.5 % IJ SOLN
INTRAMUSCULAR | Status: DC | PRN
  Administered 2016-07-12: 10 mL

## 2016-07-12 MED ORDER — LACTATED RINGERS IV SOLN
INTRAVENOUS | Status: DC
  Administered 2016-07-12: 125 mL/h via INTRAVENOUS

## 2016-07-12 MED ORDER — ONDANSETRON HCL 4 MG/2ML IJ SOLN
INTRAMUSCULAR | Status: AC
  Filled 2016-07-12: qty 2

## 2016-07-12 MED ORDER — SOD CITRATE-CITRIC ACID 500-334 MG/5ML PO SOLN
30.0000 mL | ORAL | Status: AC
  Administered 2016-07-12: 30 mL via ORAL

## 2016-07-12 MED ORDER — DEXAMETHASONE SODIUM PHOSPHATE 4 MG/ML IJ SOLN
INTRAMUSCULAR | Status: AC
  Filled 2016-07-12: qty 1

## 2016-07-12 MED ORDER — ROCURONIUM BROMIDE 100 MG/10ML IV SOLN
INTRAVENOUS | Status: AC
  Filled 2016-07-12: qty 1

## 2016-07-12 MED ORDER — KETOROLAC TROMETHAMINE 30 MG/ML IJ SOLN
INTRAMUSCULAR | Status: DC | PRN
  Administered 2016-07-12: 30 mg via INTRAVENOUS

## 2016-07-12 SURGICAL SUPPLY — 22 items
CATH ROBINSON RED A/P 16FR (CATHETERS) ×2 IMPLANT
CLOTH BEACON ORANGE TIMEOUT ST (SAFETY) ×2 IMPLANT
DERMABOND ADVANCED (GAUZE/BANDAGES/DRESSINGS) ×1
DERMABOND ADVANCED .7 DNX12 (GAUZE/BANDAGES/DRESSINGS) ×1 IMPLANT
DRSG OPSITE POSTOP 3X4 (GAUZE/BANDAGES/DRESSINGS) ×2 IMPLANT
DURAPREP 26ML APPLICATOR (WOUND CARE) ×2 IMPLANT
GLOVE BIO SURGEON STRL SZ7.5 (GLOVE) ×2 IMPLANT
GLOVE BIOGEL PI IND STRL 7.0 (GLOVE) ×2 IMPLANT
GLOVE BIOGEL PI IND STRL 8 (GLOVE) ×1 IMPLANT
GLOVE BIOGEL PI INDICATOR 7.0 (GLOVE) ×2
GLOVE BIOGEL PI INDICATOR 8 (GLOVE) ×1
GOWN STRL REUS W/TWL LRG LVL3 (GOWN DISPOSABLE) ×2 IMPLANT
GOWN STRL REUS W/TWL XL LVL3 (GOWN DISPOSABLE) ×2 IMPLANT
NS IRRIG 1000ML POUR BTL (IV SOLUTION) ×2 IMPLANT
PACK LAPAROSCOPY BASIN (CUSTOM PROCEDURE TRAY) ×2 IMPLANT
PACK TRENDGUARD 450 HYBRID PRO (MISCELLANEOUS) ×1 IMPLANT
PROTECTOR NERVE ULNAR (MISCELLANEOUS) ×4 IMPLANT
SUT MNCRL AB 4-0 PS2 18 (SUTURE) ×2 IMPLANT
SUT VICRYL 0 UR6 27IN ABS (SUTURE) ×4 IMPLANT
TOWEL OR 17X24 6PK STRL BLUE (TOWEL DISPOSABLE) ×4 IMPLANT
TRENDGUARD 450 HYBRID PRO PACK (MISCELLANEOUS) ×2
WARMER LAPAROSCOPE (MISCELLANEOUS) ×2 IMPLANT

## 2016-07-12 NOTE — Discharge Instructions (Signed)
Laparoscopic Tubal Ligation Laparoscopic tubal ligation is a procedure to close the fallopian tubes. This is done so that you cannot get pregnant. When the fallopian tubes are closed, the eggs that your ovaries release cannot enter the uterus, and sperm cannot reach the released eggs. A laparoscopic tubal ligation is sometimes called "getting your tubes tied." You should not have this procedure if you want to get pregnant someday or if you are unsure about having more children. Tell a health care provider about:  Any allergies you have.  All medicines you are taking, including vitamins, herbs, eye drops, creams, and over-the-counter medicines.  Any problems you or family members have had with anesthetic medicines.  Any blood disorders you have.  Any surgeries you have had.  Any medical conditions you have.  Whether you are pregnant or may be pregnant.  Any past pregnancies. What are the risks? Generally, this is a safe procedure. However, problems may occur, including:  Infection.  Bleeding.  Injury to surrounding organs.  Side effects from anesthetics.  Failure of the procedure.  This procedure can increase your risk of a kind of pregnancy in which a fertilized egg attaches to the outside of the uterus (ectopic pregnancy). What happens before the procedure?  Ask your health care provider about: ? Changing or stopping your regular medicines. This is especially important if you are taking diabetes medicines or blood thinners. ? Taking medicines such as aspirin and ibuprofen. These medicines can thin your blood. Do not take these medicines before your procedure if your health care provider instructs you not to.  Follow instructions from your health care provider about eating and drinking restrictions.  Plan to have someone take you home after the procedure.  If you go home right after the procedure, plan to have someone with you for 24 hours. What happens during the  procedure?  You will be given one or more of the following: ? A medicine to help you relax (sedative). ? A medicine to numb the area (local anesthetic). ? A medicine to make you fall asleep (general anesthetic). ? A medicine that is injected into an area of your body to numb everything below the injection site (regional anesthetic).  An IV tube will be inserted into one of your veins. It will be used to give you medicines and fluids during the procedure.  Your bladder may be emptied with a small tube (catheter).  If you have been given a general anesthetic, a tube will be put down your throat to help you breathe.  Two small cuts (incisions) will be made in your lower abdomen and near your belly button.  Your abdomen will be inflated with a gas. This will let the surgeon see better and will give the surgeon room to work.  A thin, lighted tube (laparoscope) with a camera attached will be inserted into your abdomen through one of the incisions. Small instruments will be inserted through the other incision.  The fallopian tubes will be tied off, burned (cauterized), or blocked with a clip, ring, or clamp. A small portion in the center of each fallopian tube may be removed.  The gas will be released from the abdomen.  The incisions will be closed with stitches (sutures).  A bandage (dressing) will be placed over the incisions. The procedure may vary among health care providers and hospitals. What happens after the procedure?  Your blood pressure, heart rate, breathing rate, and blood oxygen level will be monitored often until the medicines you   were given have worn off.  You will be given medicine to help with pain, nausea, and vomiting as needed. This information is not intended to replace advice given to you by your health care provider. Make sure you discuss any questions you have with your health care provider. Document Released: 05/03/2000 Document Revised: 07/03/2015 Document  Reviewed: 01/05/2015 Elsevier Interactive Patient Education  2018 [employer]  

## 2016-07-12 NOTE — Anesthesia Postprocedure Evaluation (Signed)
Anesthesia Post Note  Patient: Yvonne Gentry  Procedure(s) Performed: Procedure(s) (LRB): LAPAROSCOPIC TUBAL LIGATION (Bilateral)     Patient location during evaluation: PACU Anesthesia Type: General Level of consciousness: awake and alert Pain management: pain level controlled Vital Signs Assessment: post-procedure vital signs reviewed and stable Respiratory status: spontaneous breathing, nonlabored ventilation, respiratory function stable and patient connected to nasal cannula oxygen Cardiovascular status: blood pressure returned to baseline and stable Postop Assessment: no signs of nausea or vomiting Anesthetic complications: no    Last Vitals:  Vitals:   07/12/16 0900 07/12/16 0930  BP: 135/83   Pulse: 86   Resp: 14   Temp:  36.5 C    Last Pain:  Vitals:   07/12/16 0930  TempSrc:   PainSc: 4    Pain Goal: Patients Stated Pain Goal: 5 (07/12/16 16100621)               Shelton SilvasKevin D Hollis

## 2016-07-12 NOTE — Op Note (Signed)
[name] 07/12/2016  PREOPERATIVE DIAGNOSIS:  Undesired fertility  POSTOPERATIVE DIAGNOSIS:  Undesired fertility  PROCEDURE:  Laparoscopic Bilateral Tubal Sterilization using Bipolar Coagulation  ANESTHESIA:  General endotracheal and 10 cc .25 % Marcaine, local  SURGEON: Shareese Macha L. Alysia PennaErvin, MD  COMPLICATIONS:  None immediate.  ESTIMATED BLOOD LOSS:  Less than 20 ml.  FLUIDS: 1000 ml LR.  URINE OUTPUT:  10 ml of clear urine.  INDICATIONS: 26 y.o. [id number]  with undesired fertility, desires permanent sterilization. Other reversible forms of contraception were discussed with patient; she declines all other modalities.  Risks of procedure discussed with patient including permanence of method, bleeding, infection, injury to surrounding organs and need for additional procedures including laparotomy, risk of regret.  Failure risk of 0.5-1% with increased risk of ectopic gestation if pregnancy occurs was also discussed with patient.      FINDINGS:  Normal uterus, tubes, and ovaries.  TECHNIQUE:  The patient was taken to the operating room where general anesthesia was obtained without difficulty.  She was then placed in the dorsal lithotomy position and prepared and draped in sterile fashion.  After an adequate timeout was performed, a bivalved speculum was then placed in the patient's vagina, and the anterior lip of cervix grasped with the single-tooth tenaculum.  The uterine manipulator was then advanced into the uterus.  The speculum was removed from the vagina.  Attention was then turned to the patient's abdomen where a 11-mm skin incision was made in the umbilical fold. The fascia was grasped with Kocher clamps and cut. The cornors were tagged with 2/0 Vicryl. The Huzon  trocar was easily induced into the peritoneal cavity. The abdomen was then insufflated with carbon dioxide gas and adequate pneumoperitoneum was obtained.  A survey of the patient's pelvis and abdomen revealed entirely  normal anatomy.   The fallopian tubes were observed and found to be normal in appearance. Bipolar forceps was then advanced through the operative port and used to coagulate a 3 cm portion of the left tube in the mid isthmic area.  Good blanching and coagulation was noted at the site of the application.  There was no bleeding noted in the mesosalpinx.  A similar process was carried out on the right fallopian tube allowing for bilateral tubal sterilization.   Good hemostasis was noted overall. The instruments were then removed from the patient's abdomen and the fascial incision was closed with the Vicryl. The skin was closed with  4/0 Monocryl,  Dermabond and a dressing were placed. Ten cc of plain .25 Marcaine was injected at the incision site.  The uterine manipulator and the tenaculum were removed from the vagina without complications. The patient tolerated the procedure well.  Sponge, lap, and needle counts were correct times two.  The patient was then taken to the recovery room awake, extubated and in stable condition.

## 2016-07-12 NOTE — Interval H&P Note (Signed)
History and Physical Interval Note:  07/12/2016 7:11 AM  [name]  has presented today for surgery, with the diagnosis of Undesired Fertility  The various methods of treatment have been discussed with the patient and family. After consideration of risks, benefits and other options for treatment, the patient has consented to  Procedure(s): LAPAROSCOPIC TUBAL LIGATION (Bilateral) as a surgical intervention .  The patient's history has been reviewed, patient examined, no change in status, stable for surgery.  I have reviewed the patient's chart and labs.  Questions were answered to the patient's satisfaction.     Hermina StaggersMichael L Makyia Erxleben

## 2016-07-12 NOTE — Transfer of Care (Signed)
Immediate Anesthesia Transfer of Care Note  Patient: Yvonne Gentry  Procedure(s) Performed: Procedure(s): LAPAROSCOPIC TUBAL LIGATION (Bilateral)  Patient Location: PACU  Anesthesia Type:General  Level of Consciousness: awake, alert  and oriented  Airway & Oxygen Therapy: Patient Spontanous Breathing and Patient connected to nasal cannula oxygen  Post-op Assessment: Report given to RN, Post -op Vital signs reviewed and stable and Patient moving all extremities X 4  Post vital signs: Reviewed and stable  Last Vitals:  Vitals:   07/12/16 0621 07/12/16 0815  BP: (!) 118/91   Pulse: 74   Resp: 16 (!) (P) 8  Temp: 36.8 C (P) 36.4 C    Last Pain:  Vitals:   07/12/16 0621  TempSrc: Oral      Patients Stated Pain Goal: 5 (07/12/16 [id number])  Complications: No apparent anesthesia complications

## 2016-07-12 NOTE — Anesthesia Procedure Notes (Signed)
Procedure Name: Intubation Date/Time: 07/12/2016 7:21 AM Performed by: Jonna Munro Pre-anesthesia Checklist: Patient identified, Emergency Drugs available, Suction available, Timeout performed and Patient being monitored Patient Re-evaluated:Patient Re-evaluated prior to inductionOxygen Delivery Method: Circle system utilized Preoxygenation: Pre-oxygenation with 100% oxygen Intubation Type: IV induction Ventilation: Mask ventilation without difficulty Laryngoscope Size: Mac and 3 Grade View: Grade I Tube type: Oral Tube size: 7.0 mm Number of attempts: 1 Airway Equipment and Method: Stylet Placement Confirmation: ETT inserted through vocal cords under direct vision,  positive ETCO2 and breath sounds checked- equal and bilateral Secured at: 21 cm Tube secured with: Tape Dental Injury: Teeth and Oropharynx as per pre-operative assessment

## 2016-07-13 ENCOUNTER — Encounter (HOSPITAL_COMMUNITY): Payer: Self-pay | Admitting: Obstetrics and Gynecology

## 2017-06-13 IMAGING — US US OB COMP LESS 14 WK
1 series · 15 of 28 positions shown · non-contrast
Comparison: Pelvic ultrasound performed 10/26/2008

CLINICAL DATA: Acute onset of left upper quadrant abdominal pain.
Initial encounter.

EXAM:
OBSTETRIC <14 WK US AND TRANSVAGINAL OB US
TECHNIQUE: Both transabdominal and transvaginal ultrasound examinations were
performed for complete evaluation of the gestation as well as the
maternal uterus, adnexal regions, and pelvic cul-de-sac.
Transvaginal technique was performed to assess early pregnancy.

[Series 1: us ob comp less 14 wk · 64 acquisitions, 15 frames shown]
[im 1/64]
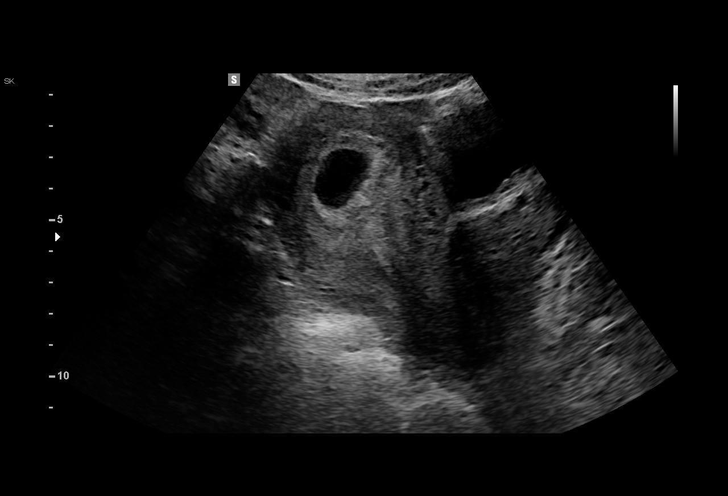
[im 5/64]
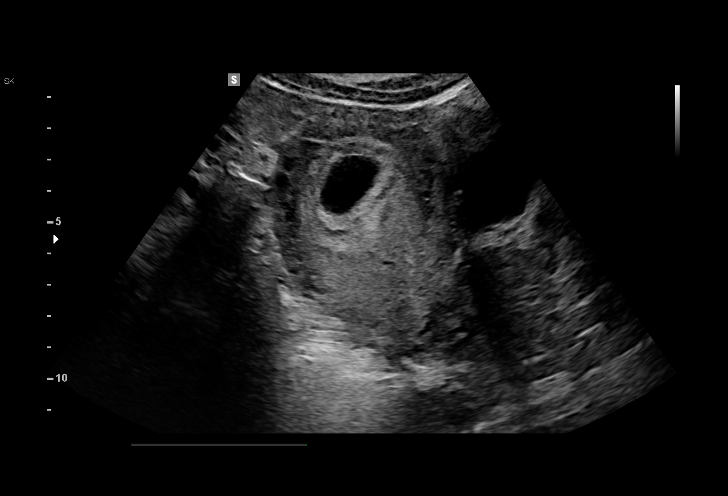
[im 10/64]
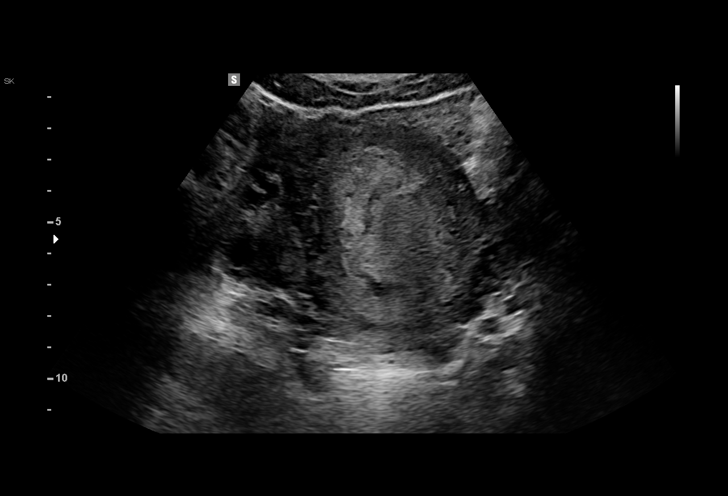
[im 15/64]
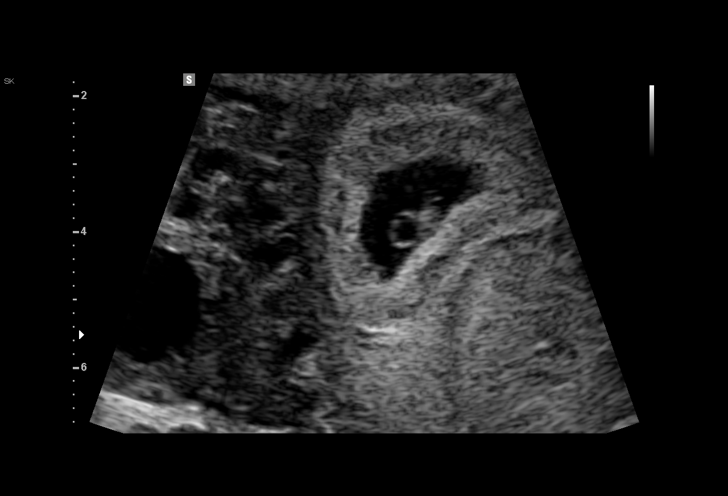
[im 19/64]
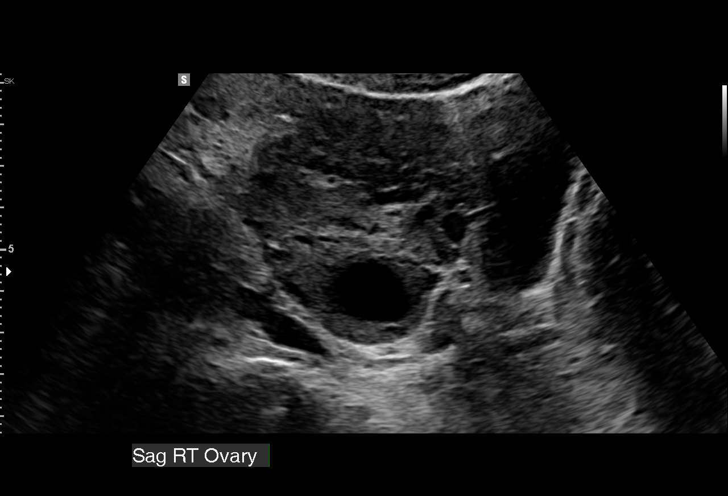
[im 24/64]
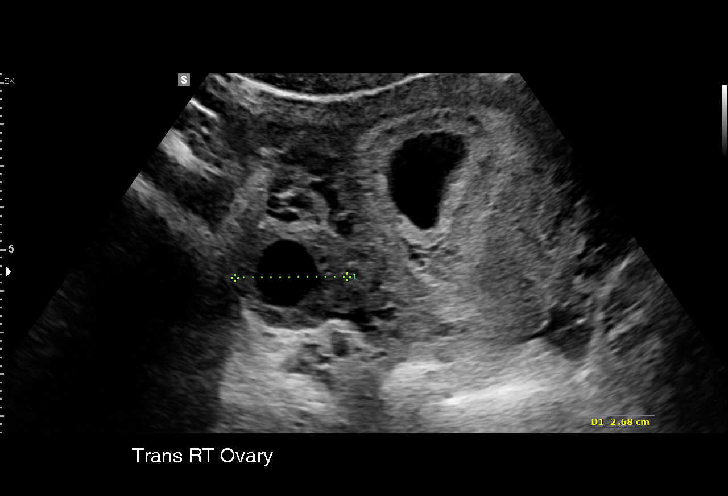
[im 29/64]
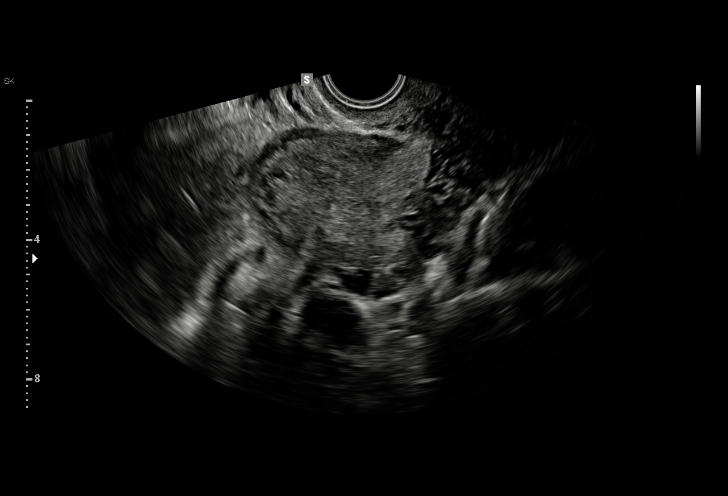
[im 33/64]
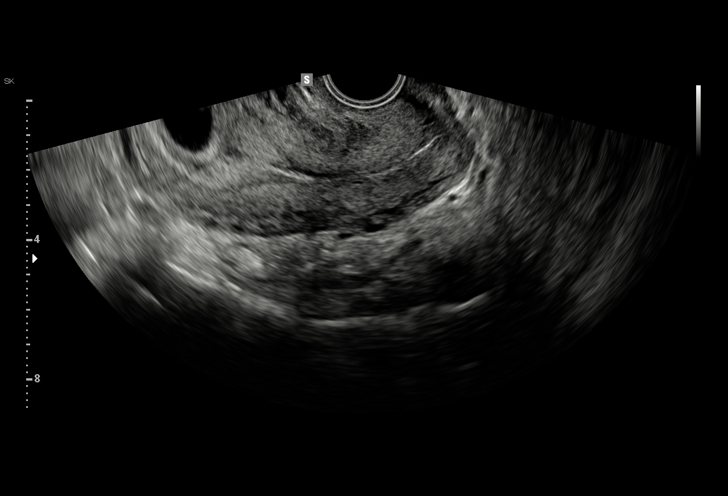
[im 36/64]
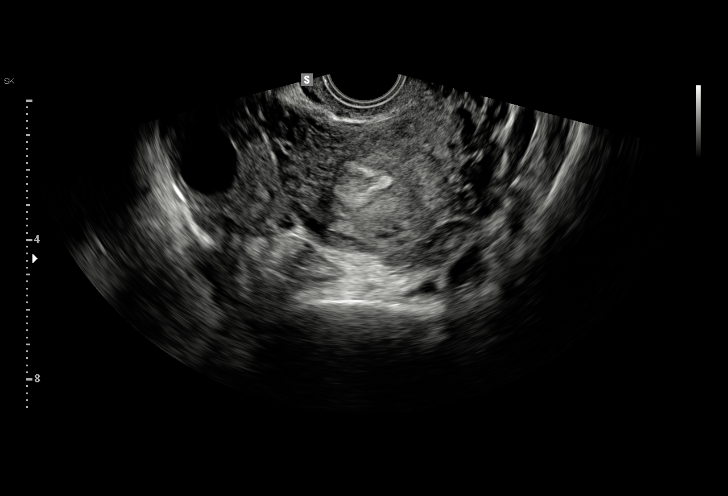
[im 40/64]
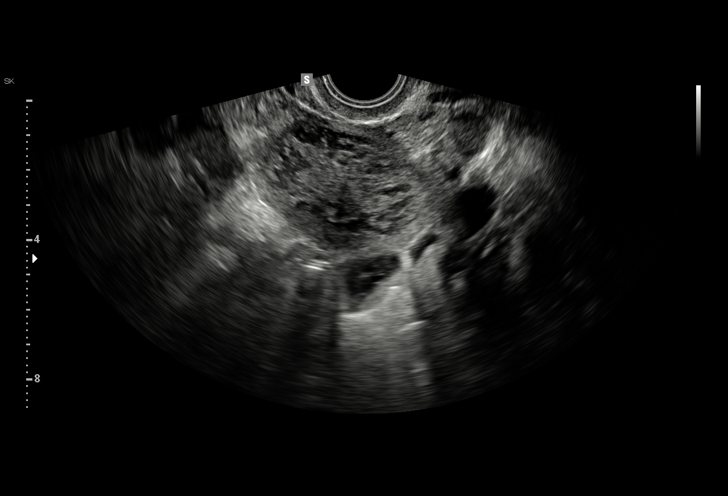
[im 45/64]
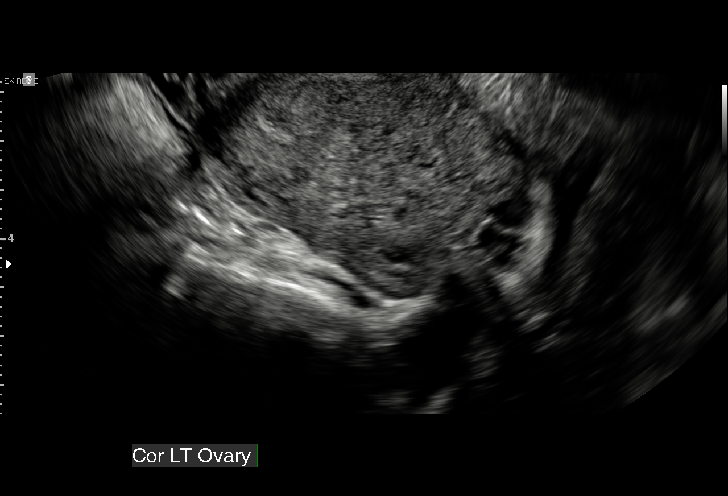
[im 50/64]
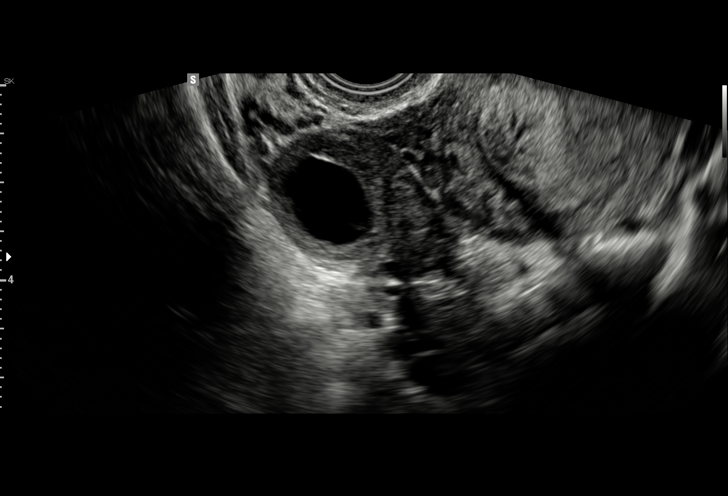
[im 54/64]
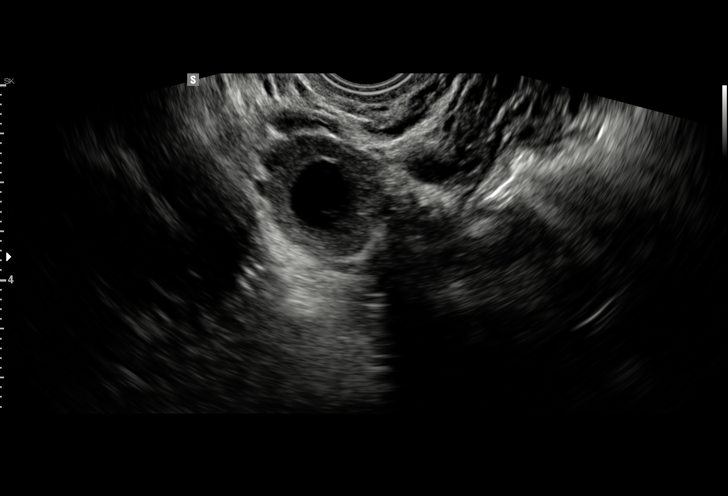
[im 59/64]
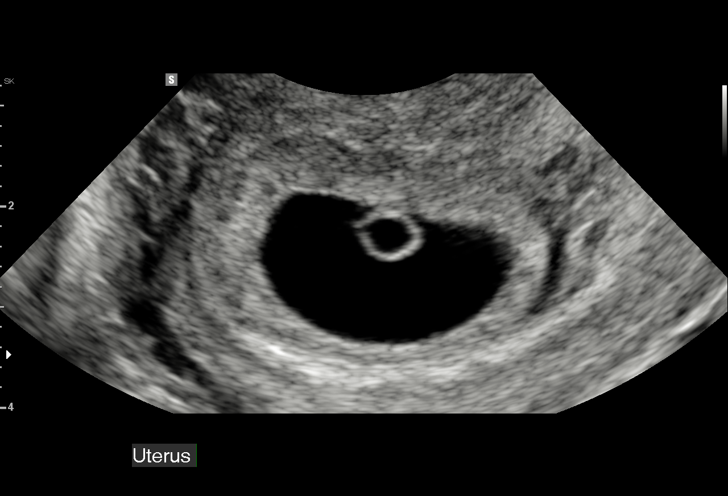
[im 64/64]
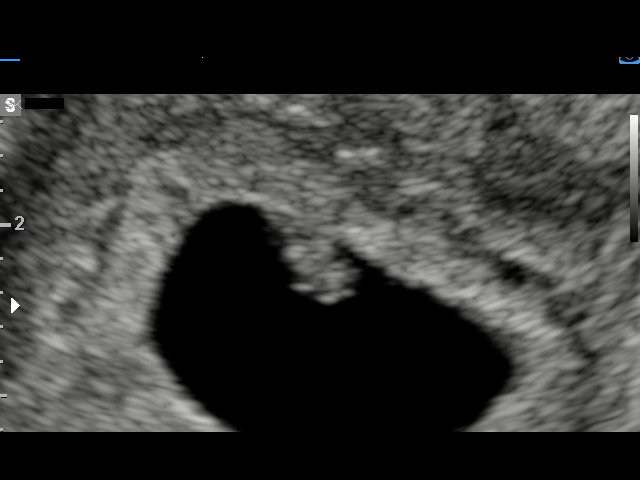

[15 of 28 positions shown; findings below may reference images not displayed]

FINDINGS: Intrauterine gestational sac: Single; visualized and normal in
shape.

Yolk sac:  Yes

Embryo:  Yes

Cardiac Activity: Yes

Heart Rate: 121  bpm

CRL:  6.2  mm   6 w   3 d                  US EDC: 04/05/2016

Subchorionic hemorrhage:  None visualized.

Maternal uterus/adnexae: The uterus is unremarkable in appearance.

The ovaries are within normal limits. The right ovary measures 4.2 x
2.9 x 3.0 cm, while the left ovary measures 2.9 x 1.0 x 1.6 cm. No
suspicious adnexal masses are seen; there is no evidence for ovarian
torsion.

No free fluid is seen within the pelvic cul-de-sac.
IMPRESSION: Single live intrauterine pregnancy noted, with a crown-rump length
of 6 mm, corresponding to a gestational age of 6 weeks 3 days. This
reflects an estimated date of delivery April 05, 2016.

## 2017-09-25 IMAGING — US US ABDOMEN LIMITED
1 series · 15 of 25 positions shown · non-contrast
Comparison: August 15, 2015

CLINICAL DATA: Right-sided abdominal pain

EXAM:
US ABDOMEN LIMITED - RIGHT UPPER QUADRANT

[Series 1: us abdomen limited · 15 of 56 slices shown]
[im 1/56]
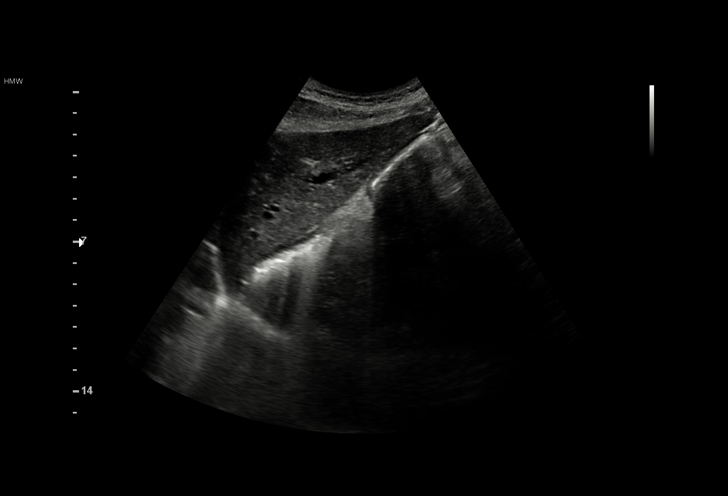
[im 5/56]
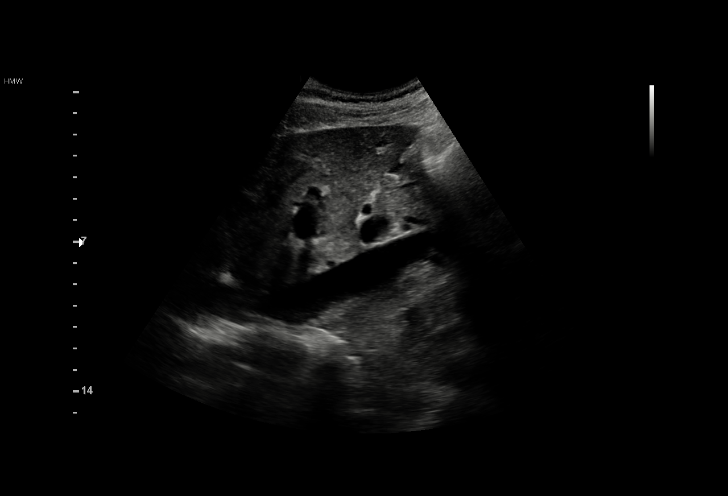
[im 10/56]
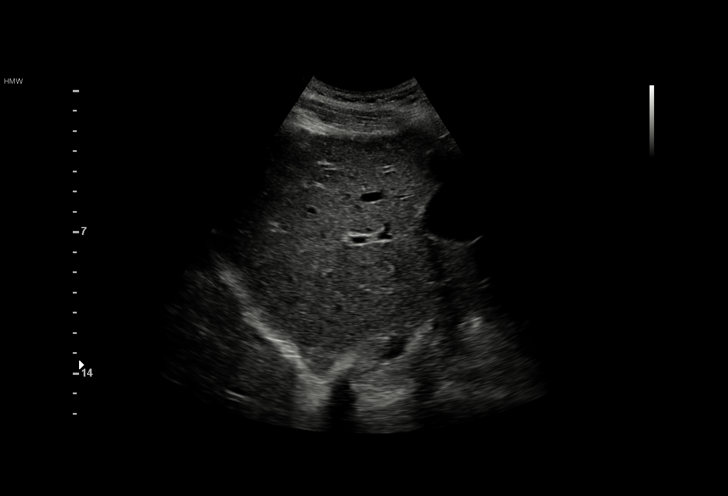
[im 12/56]
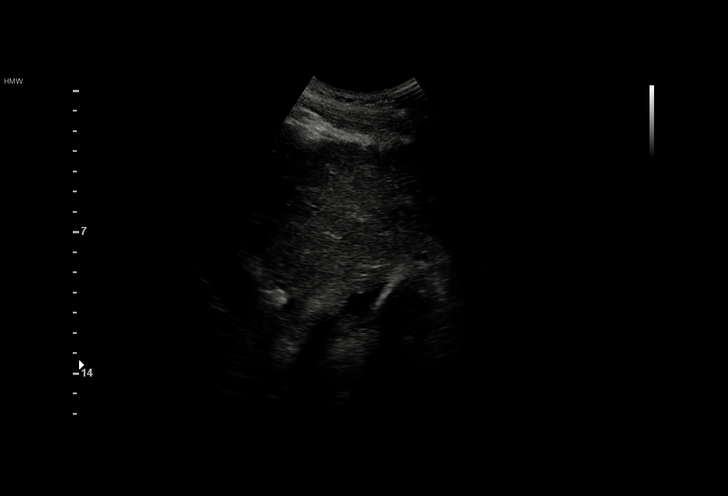
[im 17/56]
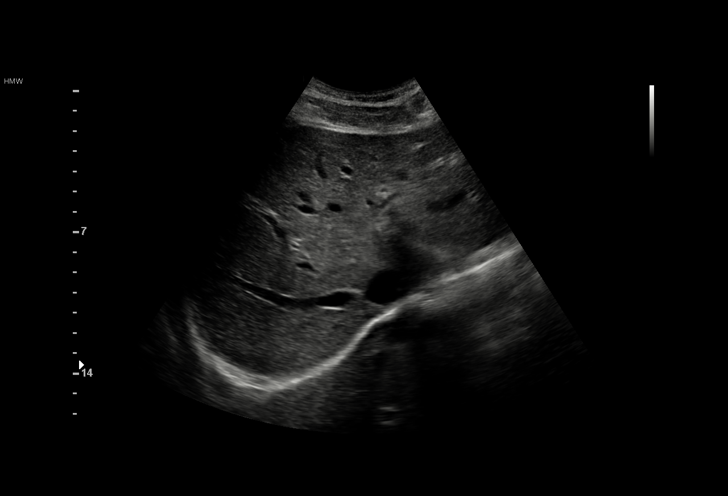
[im 21/56]
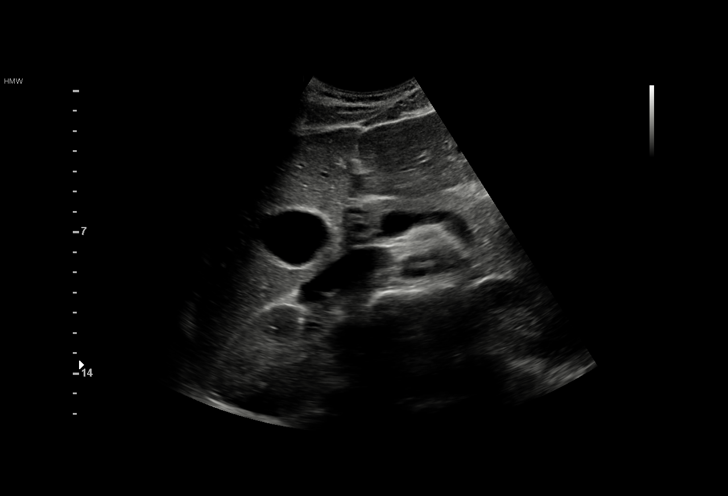
[im 23/56]
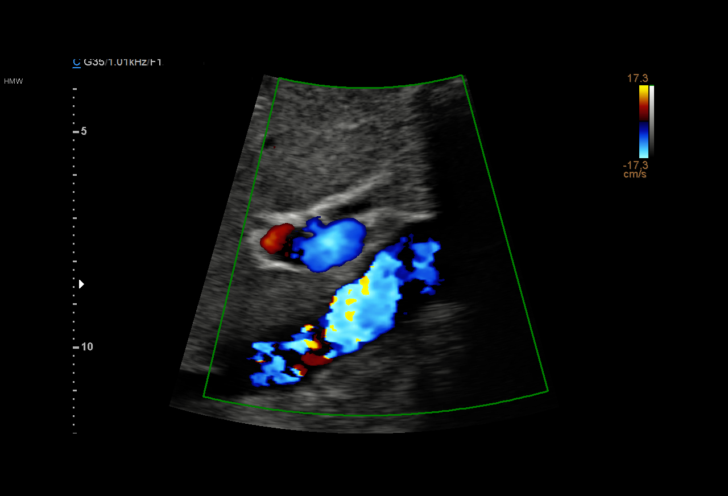
[im 28/56]
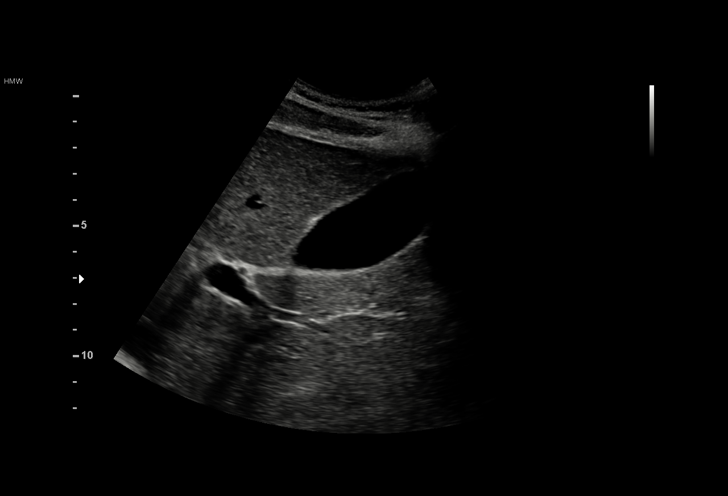
[im 33/56]
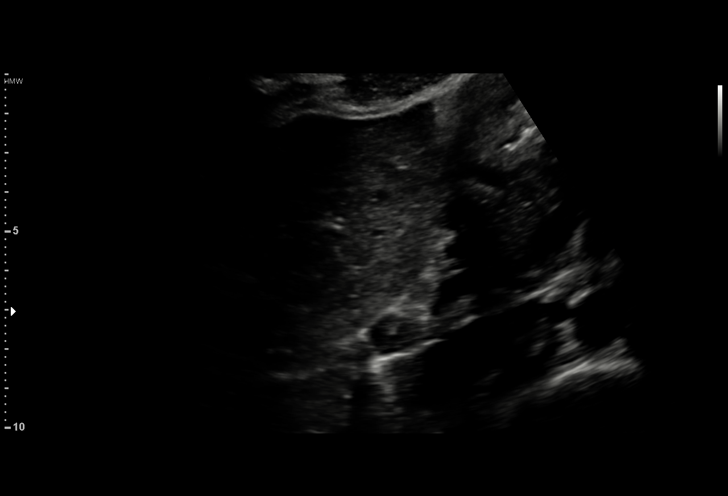
[im 35/56]
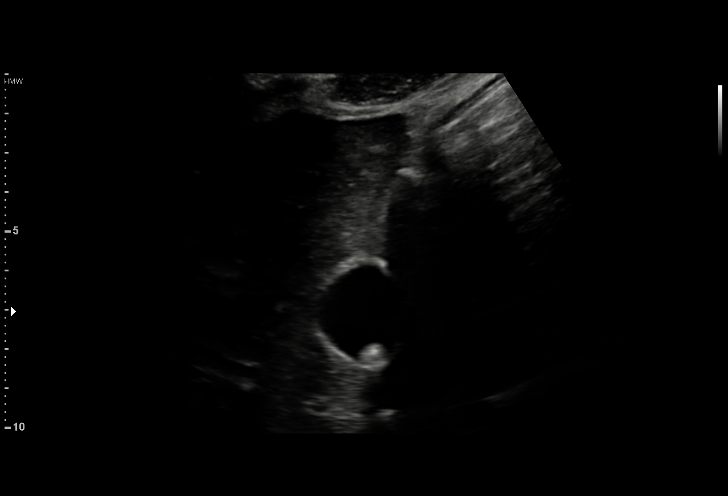
[im 39/56]
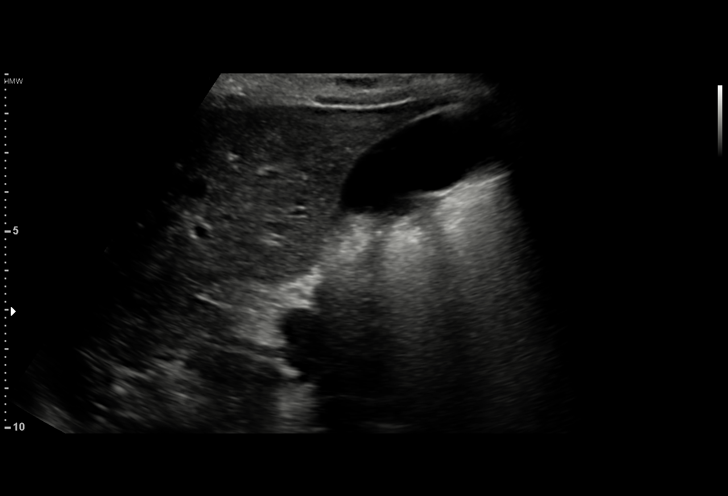
[im 44/56]
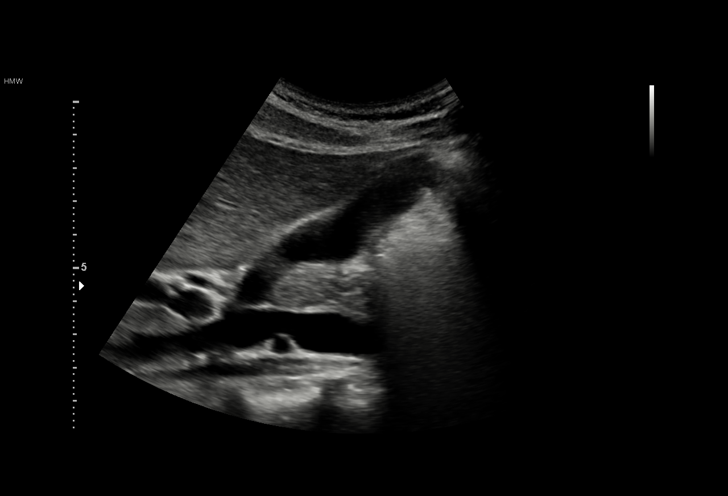
[im 46/56]
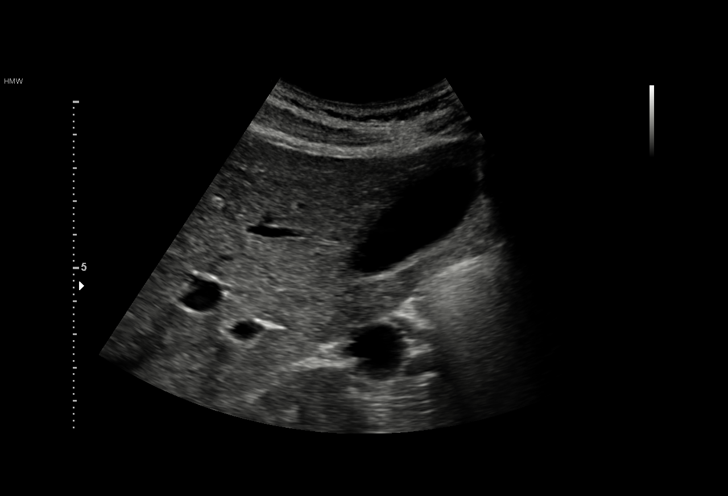
[im 51/56]
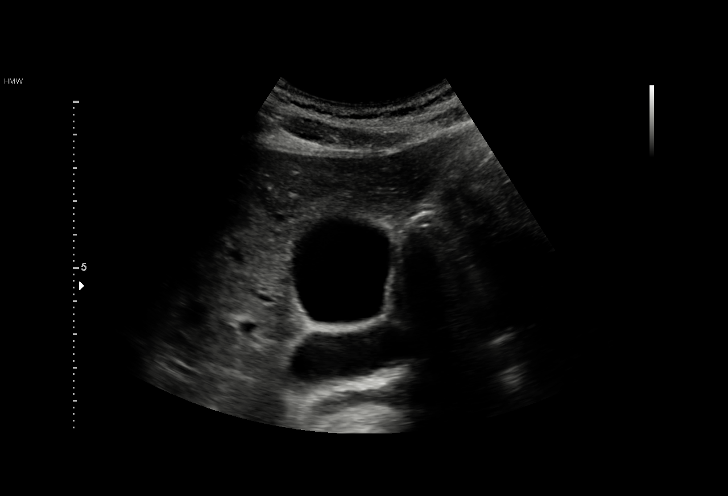
[im 56/56]
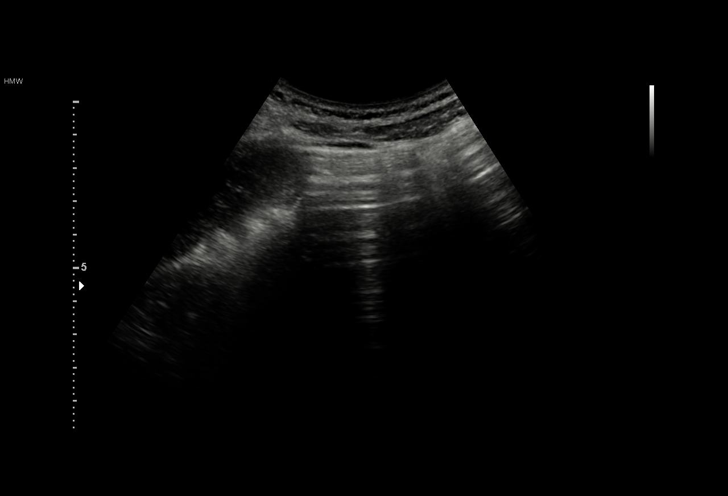

[15 of 25 positions shown; findings below may reference images not displayed]

FINDINGS: Gallbladder:

Within the gallbladder, there is a a 9 mm in length echogenic focus
which moves in shadows consistent with cholelithiasis. There is no
gallbladder wall thickening or pericholecystic fluid. A septation is
noted within the gallbladder, an anatomic variant. No sonographic
Murphy sign noted by sonographer.

Common bile duct:

Diameter: 6 mm. No intrahepatic or extrahepatic biliary duct
dilatation.

Liver:

No focal lesion identified. Within normal limits in parenchymal
echogenicity.
IMPRESSION: Cholelithiasis.  Study otherwise unremarkable.

## 2017-10-09 ENCOUNTER — Emergency Department (HOSPITAL_BASED_OUTPATIENT_CLINIC_OR_DEPARTMENT_OTHER)
Admission: EM | Admit: 2017-10-09 | Discharge: 2017-10-10 | Disposition: A | Discharge status: Home / Self Care | Payer: Self-pay | Attending: Emergency Medicine | Admitting: Emergency Medicine

## 2017-10-09 ENCOUNTER — Other Ambulatory Visit: Payer: Self-pay

## 2017-10-09 ENCOUNTER — Encounter (HOSPITAL_BASED_OUTPATIENT_CLINIC_OR_DEPARTMENT_OTHER): Payer: Self-pay | Admitting: *Deleted

## 2017-10-09 ENCOUNTER — Emergency Department (HOSPITAL_BASED_OUTPATIENT_CLINIC_OR_DEPARTMENT_OTHER): Payer: Self-pay

## 2017-10-09 DIAGNOSIS — B9789 Other viral agents as the cause of diseases classified elsewhere: Secondary | ICD-10-CM

## 2017-10-09 DIAGNOSIS — J45909 Unspecified asthma, uncomplicated: Secondary | ICD-10-CM | POA: Insufficient documentation

## 2017-10-09 DIAGNOSIS — Z79899 Other long term (current) drug therapy: Secondary | ICD-10-CM | POA: Insufficient documentation

## 2017-10-09 DIAGNOSIS — J069 Acute upper respiratory infection, unspecified: Secondary | ICD-10-CM | POA: Insufficient documentation

## 2017-10-09 LAB — GROUP A STREP BY PCR: Group A Strep by PCR: NOT DETECTED

## 2017-10-09 MED ORDER — BENZONATATE 100 MG PO CAPS: 100.0000 mg | ORAL_CAPSULE | Freq: Three times a day (TID) | ORAL | 0 refills | Status: DC

## 2017-10-09 MED ORDER — PREDNISONE 20 MG PO TABS: 40.0000 mg | ORAL_TABLET | Freq: Every day | ORAL | 0 refills | Status: AC

## 2017-10-09 NOTE — ED Triage Notes (Signed)
Pt reports cough and sore throat this week. States yesterday her lips were swollen- she took benadryl with relief- Sx resolved today. Denies known fever but reports having chills

## 2017-10-09 NOTE — ED Triage Notes (Signed)
Pt states she has been using her inhaler "a lot". RT evaluating pt in triage

## 2017-10-09 NOTE — ED Notes (Signed)
Osculated Pt in triage. Bilat breath sounds clear and slightly dim in right upper.

## 2017-10-09 NOTE — ED Notes (Signed)
Patient transported to X-ray 

## 2017-10-09 NOTE — ED Provider Notes (Signed)
[facility] EMERGENCY DEPARTMENT Provider Note   CSN: [id number] Arrival date & time: 10/09/17  1937     History   Chief Complaint Chief Complaint  Patient presents with  . Cough    HPI Yvonne Gentry is a 27 y.o. female.  HPI  Yvonne Gentry is a 27 year old female with a history of asthma who presents to the emergency department for evaluation of cough, sore throat, congestion and chills.  She reports that her symptoms started about 6 days ago.  Her son was recently sick with an upper respiratory tract infection and she thinks she may have gotten sick from him.  She states that she has a nonproductive dry cough.  She has some associated wheezing and has been using her albuterol inhaler more often which helps with the cough somewhat.  She is also been taking over-the-counter cough.  She reports chest pain occasionally with coughing only.  She reports that she has a sore throat, worsened with swallowing.  Also feels congested in her nose and face.  Has sinus pressure.  She has chills, no measured temperature at home.  She denies shortness of breath, hemoptysis or leg swelling.  She denies headache, neck stiffness, ear pain, dysphagia, abdominal pain, vomiting.  Past Medical History:  Diagnosis Date  . Asthma   . Gallstones   . Panic attack   . Pollen allergies     Patient Active Problem List   Diagnosis Date Noted  . 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 LAPAROSCOPIC TUBAL LIGATION Bilateral 07/12/2016   Procedure: LAPAROSCOPIC TUBAL LIGATION;  Surgeon: Hermina Staggers, MD;  Location: [facility];  Service: Gynecology;  Laterality: Bilateral;  . NO PAST SURGERIES       OB History    Gravida  3   Para  3   Term  3   Preterm  0   AB  0   Living  3     SAB  0   TAB  0   Ectopic  0   Multiple  0   Live Births  3            Home Medications    Prior to Admission medications   Medication Sig  Start Date End Date Taking? Authorizing Provider  albuterol (PROVENTIL HFA;VENTOLIN HFA) 108 (90 Base) MCG/ACT inhaler Inhale 2 puffs into the lungs every 6 (six) hours as needed for wheezing or shortness of breath.   Yes [provider]  albuterol (PROVENTIL) (2.5 MG/3ML) 0.083% nebulizer solution Take 2.5 mg by nebulization every 6 (six) hours as needed for wheezing or shortness of breath.   Yes [provider]  ibuprofen (ADVIL,MOTRIN) 800 MG tablet Take 1 tablet (800 mg total) by mouth every 8 (eight) hours as needed for moderate pain. 07/12/16   Hermina Staggers, MD  oxyCODONE (OXY IR/ROXICODONE) 5 MG immediate release tablet Take 1-2 tablets (5-10 mg total) by mouth every 6 (six) hours as needed for severe pain. 07/12/16   Hermina Staggers, MD  Prenatal MV & Min w/FA-DHA (PRENATAL ADULT GUMMY/DHA/FA) 0.4-25 MG CHEW Chew 1 tablet by mouth 2 (two) times daily.    [provider]    Family History Family History  Problem Relation Age of Onset  . Hypertension Father   . Diabetes Brother   . Cancer Mother     Social History Social History   Tobacco Use  . Smoking status: Never Smoker  .  Smokeless tobacco: Never Used  Substance Use Topics  . Alcohol use: Not Currently  . Drug use: No     Allergies   Patient has no known allergies.   Review of Systems Review of Systems  Constitutional: Positive for chills. Negative for fever.  HENT: Positive for congestion, sinus pressure and sore throat. Negative for ear pain, facial swelling and trouble swallowing.   Eyes: Negative for visual disturbance.  Respiratory: Positive for cough and wheezing. Negative for shortness of breath.   Cardiovascular: Positive for chest pain (with cough). Negative for leg swelling.  Gastrointestinal: Negative for abdominal pain, diarrhea, nausea and vomiting.  Genitourinary: Negative for dysuria.  Musculoskeletal: Negative for gait problem, neck pain and neck stiffness.  Skin:  Negative for rash.  Neurological: Negative for syncope.  All other systems reviewed and are negative.    Physical Exam Updated Vital Signs BP 116/74 (BP Location: Right Arm)   Pulse 72   Temp 98.3 F (36.8 C) (Oral)   Resp 20   Ht 5\' 5"  (1.651 m)   Wt 56.7 kg   LMP 10/02/2017   SpO2 99%   Breastfeeding? Yes   BMI 20.80 kg/m   Physical Exam  Constitutional: She is oriented to person, place, and time. She appears well-developed and well-nourished. No distress.  HENT:  Head: Normocephalic and atraumatic.  Mucous memories moist.  Posterior oropharynx erythematous.  No tonsillar edema or exudate.  Uvula midline.  Able to handle oral secretions.  Bilateral nares without rhinorrhea.  No facial swelling.  No maxillary or frontal sinus tenderness.  Eyes: Pupils are equal, round, and reactive to light. Conjunctivae are normal. Right eye exhibits no discharge. Left eye exhibits no discharge.  Neck: Normal range of motion. Neck supple.  Cardiovascular: Normal rate, regular rhythm and intact distal pulses. Exam reveals no friction rub.  No murmur heard. Pulmonary/Chest: Effort normal. No respiratory distress.  No respiratory distress.  Speaking full sentences.  Dry, hacking cough.  Faint expiratory rhonchi heard in bilateral lung bases.  No stridor, wheezes or rales.  Abdominal: Soft. There is no tenderness.  Musculoskeletal:  No leg swelling.  Lymphadenopathy:    She has no cervical adenopathy.  Neurological: She is alert and oriented to person, place, and time. Coordination normal.  Skin: Skin is warm and dry. She is not diaphoretic.  Psychiatric: She has a normal mood and affect. Her behavior is normal.  Nursing note and vitals reviewed.   ED Treatments / Results  Labs (all labs ordered are listed, but only abnormal results are displayed) Labs Reviewed  GROUP A STREP BY PCR    EKG None  Radiology Dg Chest 2 View  Result Date: 10/09/2017 CLINICAL DATA:  27 year old female  with history of cough, chills and congestion. Sore throat for the past 6 days. EXAM: CHEST - 2 VIEW COMPARISON:  None. FINDINGS: Lung volumes are normal. No consolidative airspace disease. No pleural effusions. No pneumothorax. No pulmonary nodule or mass noted. Pulmonary vasculature and the cardiomediastinal silhouette are within normal limits. IMPRESSION: No radiographic evidence of acute cardiopulmonary disease. Electronically Signed   By: Trudie Reed M.D.   On: 10/09/2017 23:26    Procedures Procedures (including critical care time)  Medications Ordered in ED Medications - No data to display   Initial Impression / Assessment and Plan / ED Course  I have reviewed the triage vital signs and the nursing notes.  Pertinent labs & imaging results that were available during my care of the patient were  reviewed by me and considered in my medical decision making (see chart for details).    Symptoms consistent with bronchitis secondary to viral URI.  Chest x-ray negative for acute abnormality, no pneumonia.  Rapid strep test negative.  Plan to treat with steroid burst and albuterol inhaler as needed for wheezing and shortness of breath.  Will also discharge home with Tessalon Perles.  Counseled her on return precautions and she agrees and appears reliable.  Final Clinical Impressions(s) / ED Diagnoses   Final diagnoses:  Viral URI with cough    ED Discharge Orders         Ordered    predniSONE (DELTASONE) 20 MG tablet  Daily     10/09/17 2351    benzonatate (TESSALON) 100 MG capsule  Every 8 hours     10/09/17 2351           Kellie Shropshire, PA-C 10/09/17 2354    Tegeler, Canary Brim, MD 10/10/17 [phone]

## 2017-10-09 NOTE — Discharge Instructions (Signed)
Your chest x-ray was normal.  You do not have strep throat.  Please take steroid medicine for the next 5 days.  Use your albuterol inhaler at home as needed for wheezing.  You can take Tessalon Perles to help with your cough.  Continue taking cough syrup at home.  Return to the emergency department if you have any new or concerning symptoms like trouble breathing, chest pain, coughing up blood.

## 2017-10-10 NOTE — ED Notes (Signed)
Pt understood dc material. NAD noted. Script given at [employer] along with work excuse

## 2017-11-07 ENCOUNTER — Encounter (HOSPITAL_BASED_OUTPATIENT_CLINIC_OR_DEPARTMENT_OTHER): Payer: Self-pay | Admitting: Emergency Medicine

## 2017-11-07 ENCOUNTER — Emergency Department (HOSPITAL_BASED_OUTPATIENT_CLINIC_OR_DEPARTMENT_OTHER): Payer: No Typology Code available for payment source

## 2017-11-07 ENCOUNTER — Emergency Department (HOSPITAL_BASED_OUTPATIENT_CLINIC_OR_DEPARTMENT_OTHER)
Admission: EM | Admit: 2017-11-07 | Discharge: 2017-11-07 | Disposition: A | Discharge status: Home / Self Care | Payer: No Typology Code available for payment source | Attending: Emergency Medicine | Admitting: Emergency Medicine

## 2017-11-07 ENCOUNTER — Other Ambulatory Visit: Payer: Self-pay

## 2017-11-07 DIAGNOSIS — Y9301 Activity, walking, marching and hiking: Secondary | ICD-10-CM | POA: Diagnosis not present

## 2017-11-07 DIAGNOSIS — J45909 Unspecified asthma, uncomplicated: Secondary | ICD-10-CM | POA: Diagnosis not present

## 2017-11-07 DIAGNOSIS — X509XXA Other and unspecified overexertion or strenuous movements or postures, initial encounter: Secondary | ICD-10-CM | POA: Insufficient documentation

## 2017-11-07 DIAGNOSIS — Y990 Civilian activity done for income or pay: Secondary | ICD-10-CM | POA: Diagnosis not present

## 2017-11-07 DIAGNOSIS — S93402A Sprain of unspecified ligament of left ankle, initial encounter: Secondary | ICD-10-CM | POA: Insufficient documentation

## 2017-11-07 DIAGNOSIS — S99912A Unspecified injury of left ankle, initial encounter: Secondary | ICD-10-CM | POA: Diagnosis present

## 2017-11-07 DIAGNOSIS — Y9289 Other specified places as the place of occurrence of the external cause: Secondary | ICD-10-CM | POA: Diagnosis not present

## 2017-11-07 MED ORDER — NAPROXEN 500 MG PO TABS: 500.0000 mg | ORAL_TABLET | Freq: Two times a day (BID) | ORAL | 0 refills | Status: DC

## 2017-11-07 MED ORDER — HYDROCODONE-ACETAMINOPHEN 5-325 MG PO TABS
1.0000 | ORAL_TABLET | Freq: Once | ORAL | Status: DC
  Filled 2017-11-07: qty 1

## 2017-11-07 MED ORDER — IBUPROFEN 400 MG PO TABS
600.0000 mg | ORAL_TABLET | Freq: Once | ORAL | Status: AC
  Administered 2017-11-07: 600 mg via ORAL
  Filled 2017-11-07: qty 1

## 2017-11-07 NOTE — ED Triage Notes (Signed)
Pt reports left ankle injury at work today  At 4 Am. Obvious swelling and limited painful ROM.

## 2017-11-07 NOTE — ED Provider Notes (Signed)
[facility] Emergency Department Provider Note MRN:  [id number]  Arrival date & time: 11/07/17     Chief Complaint   Ankle Injury (left)   History of Present Illness   Yvonne Gentry is a 27 y.o. year-old female with a history of asthma presenting to the ED with chief complaint of ankle pain.  Patient was moving boxes at work when at 4 AM she tripped and rolled her left ankle.  Heard a popping sound or sensation.  Since then is experiencing constant moderate severity left ankle pain, worse with motion.  Denies any other injuries.  Not improved by Tylenol at home.  Review of Systems  A problem-focused ROS was performed. Positive for ankle pain.  Patient denies other trauma, no fall.  Patient's Health History    Past Medical History:  Diagnosis Date  . Asthma   . Gallstones   . Panic attack   . Pollen allergies     Past Surgical History:  Procedure Laterality Date  . LAPAROSCOPIC TUBAL LIGATION Bilateral 07/12/2016   Procedure: LAPAROSCOPIC TUBAL LIGATION;  Surgeon: Hermina Staggers, MD;  Location: [facility];  Service: Gynecology;  Laterality: Bilateral;  . NO PAST SURGERIES      Family History  Problem Relation Age of Onset  . Hypertension Father   . Diabetes Brother   . Cancer Mother     Social History   Socioeconomic History  . Marital status: Single    Spouse name: Not on file  . Number of children: Not on file  . Years of education: Not on file  . Highest education level: Not on file  Occupational History  . Not on file  Social Needs  . Financial resource strain: Not on file  . Food insecurity:    Worry: Not on file    Inability: Not on file  . Transportation needs:    Medical: Not on file    Non-medical: Not on file  Tobacco Use  . Smoking status: Never Smoker  . Smokeless tobacco: Never Used  Substance and Sexual Activity  . Alcohol use: Not Currently  . Drug use: No  . Sexual activity: Yes    Birth control/protection:  Surgical  Lifestyle  . Physical activity:    Days per week: Not on file    Minutes per session: Not on file  . Stress: Not on file  Relationships  . Social connections:    Talks on phone: Not on file    Gets together: Not on file    Attends religious service: Not on file    Active member of club or organization: Not on file    Attends meetings of clubs or organizations: Not on file    Relationship status: Not on file  . Intimate partner violence:    Fear of current or ex partner: Not on file    Emotionally abused: Not on file    Physically abused: Not on file    Forced sexual activity: Not on file  Other Topics Concern  . Not on file  Social History Narrative  . Not on file     Physical Exam  Vital Signs and Nursing Notes reviewed Vitals:   11/07/17 0800  BP: (!) 141/82  Pulse: 84  Resp: 20  Temp: 98.1 F (36.7 C)  SpO2: 100%    CONSTITUTIONAL: Well-appearing, NAD NEURO:  Alert and oriented x 3, no focal deficits EYES:  eyes equal and reactive ENT/NECK:  no LAD, no JVD CARDIO:  Regular rate, well-perfused, normal S1 and S2 PULM:  CTAB no wheezing or rhonchi GI/GU:  normal bowel sounds, non-distended, non-tender MSK/SPINE:  No gross deformities, mild edema surrounding the left lateral malleolus, tenderness to palpation to the left foot, ankle SKIN:  no rash, atraumatic PSYCH:  Appropriate speech and behavior  Diagnostic and Interventional Summary    EKG Interpretation  Date/Time:    Ventricular Rate:    PR Interval:    QRS Duration:   QT Interval:    QTC Calculation:   R Axis:     Text Interpretation:        Labs Reviewed - No data to display  DG Ankle Complete Left  Final Result    DG Foot Complete Left  Final Result    DG Tibia/Fibula Left  Final Result      Medications  ibuprofen (ADVIL,MOTRIN) tablet 600 mg (600 mg Oral Given 11/07/17 0829)     Procedures Critical Care  ED Course and Medical Decision Making  I have reviewed the triage  vital signs and the nursing notes.  Pertinent labs & imaging results that were available during my care of the patient were reviewed by me and considered in my medical decision making (see below for details).    Sprain versus fracture, x-rays pending.  X-rays with no acute fracture, favoring sprain.  Crutches provided, will follow-up with PCP.  After the discussed management above, the patient was determined to be safe for discharge.  The patient was in agreement with this plan and all questions regarding their care were answered.  ED return precautions were discussed and the patient will return to the ED with any significant worsening of condition.  Elmer Sow. Pilar Plate, MD [facility] Emergency Medicine [facility] [email or web address]  Final Clinical Impressions(s) / ED Diagnoses     ICD-10-CM   1. Sprain of left ankle, unspecified ligament, initial encounter [phone]     ED Discharge Orders    None         Sabas Sous, MD 11/07/17 [phone]

## 2017-11-07 NOTE — Discharge Instructions (Addendum)
You were evaluated in the Emergency Department and after careful evaluation, we did not find any emergent condition requiring admission or further testing in the hospital.  Your symptoms today seem to be due to an ankle sprain.  There were no broken bones in your x-rays today.  You should use ice and elevation of the ankle for the next 1 to 2 days.  Use the crutches as needed.  If not significantly improved in 2 weeks, he should follow-up with your primary care provider.  Please return to the Emergency Department if you experience any worsening of your condition.  We encourage you to follow up with a primary care provider.  Thank you for allowing [place] to be a part of your care.

## 2017-11-11 IMAGING — US US RENAL
1 series · 15 of 25 positions shown · non-contrast
Comparison: Abdominal ultrasound 08/15/2015.

CLINICAL DATA: Pregnant patient with flank pain and hematuria. No
known injury.

EXAM:
RENAL / URINARY TRACT ULTRASOUND COMPLETE

[Series 1: us renal · 15 of 40 slices shown]
[im 1/40]
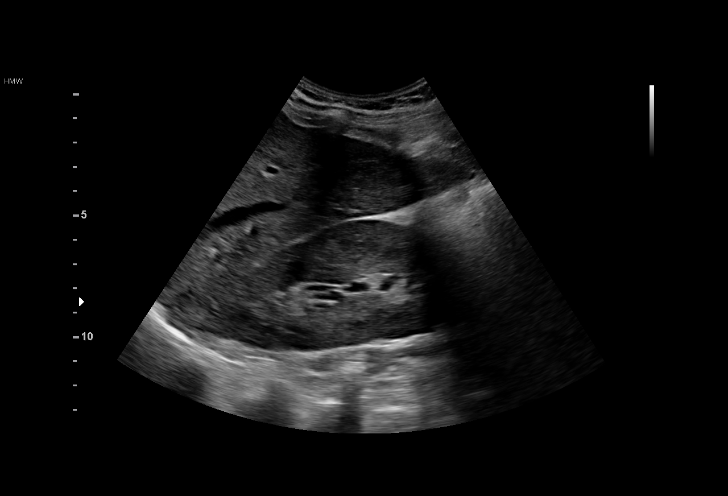
[im 4/40]
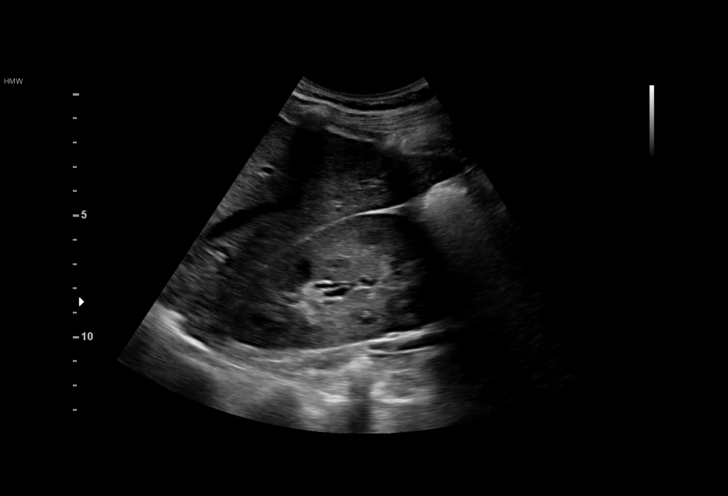
[im 7/40]
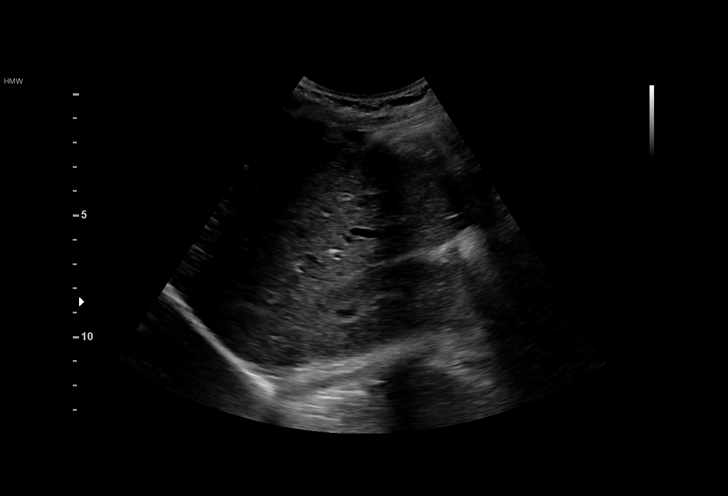
[im 9/40]
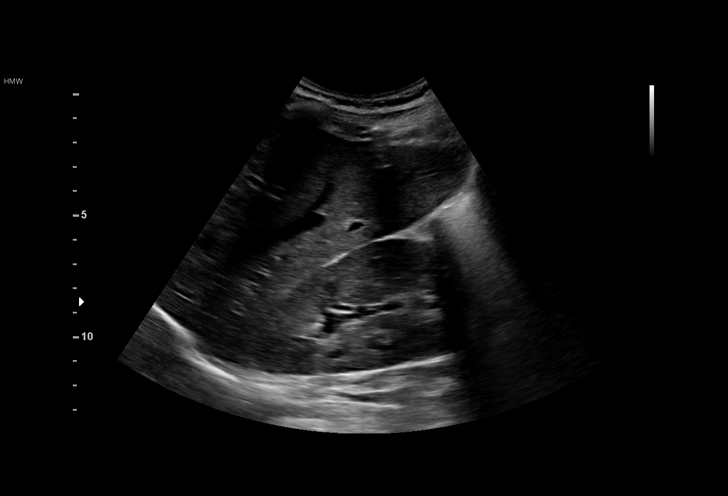
[im 12/40]
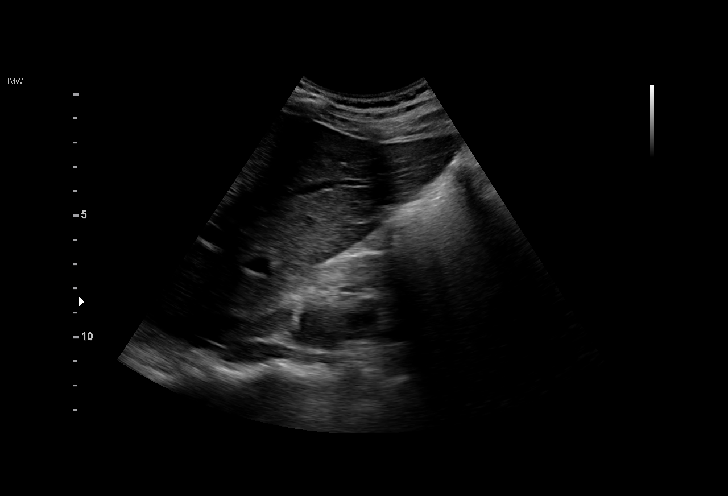
[im 15/40]
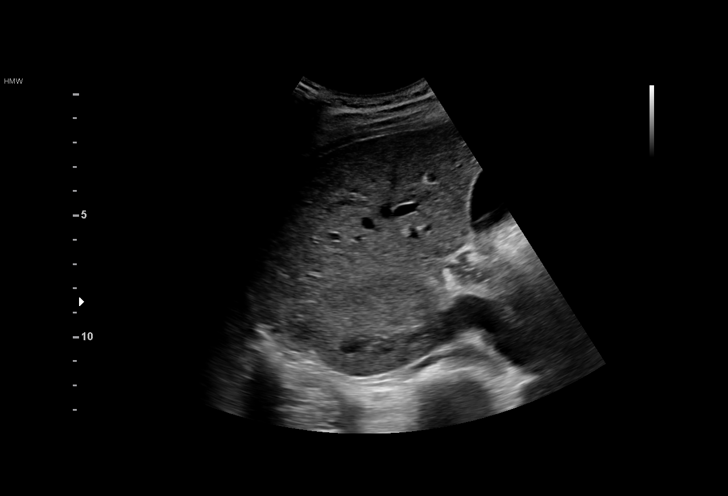
[im 17/40]
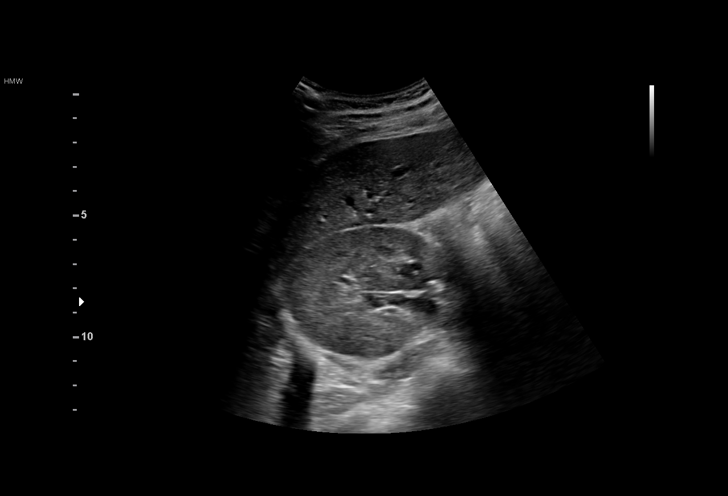
[im 20/40]
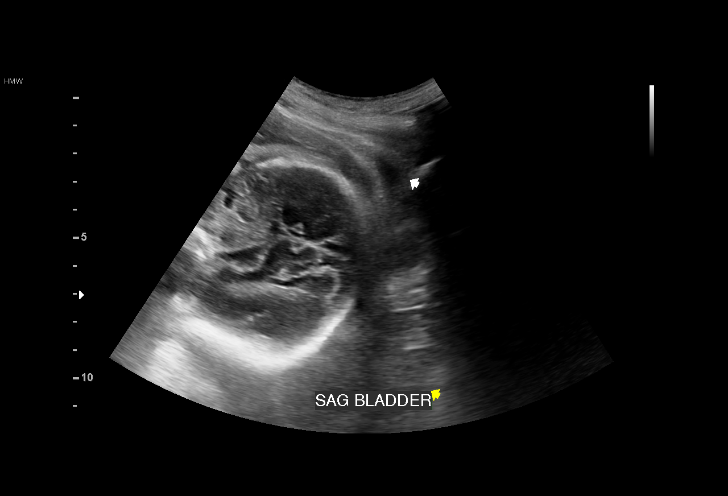
[im 23/40]
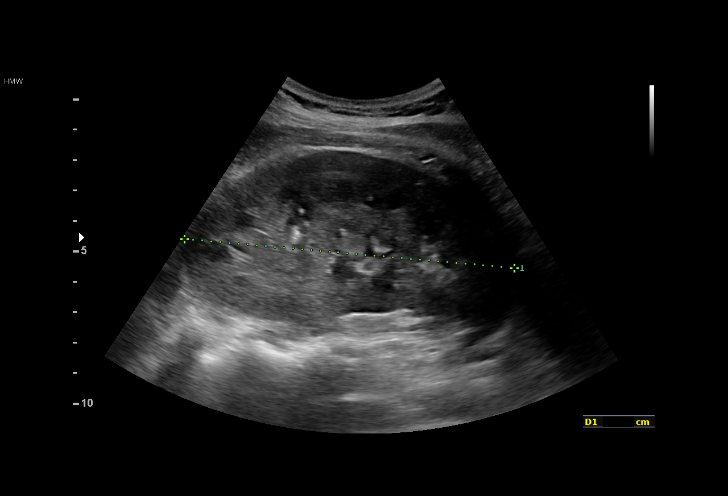
[im 25/40]
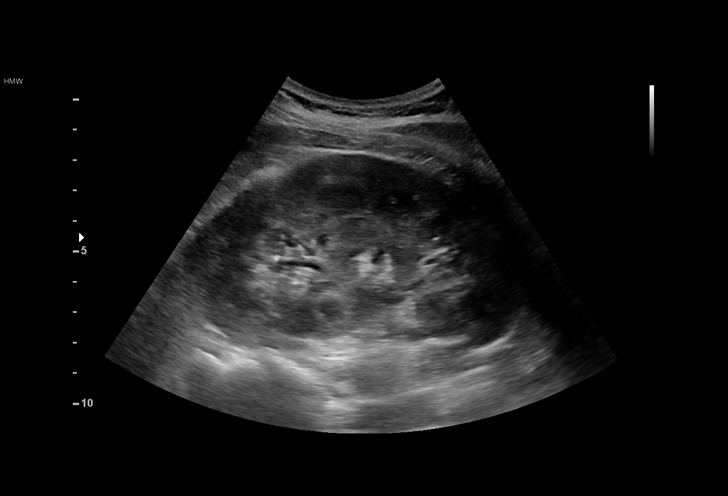
[im 28/40]
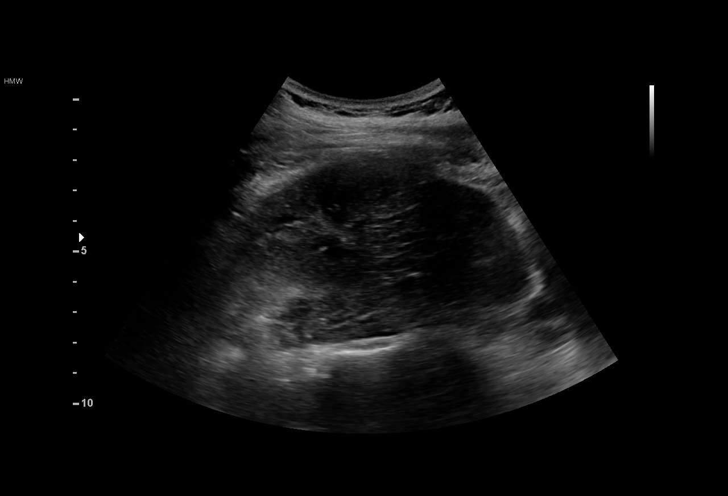
[im 31/40]
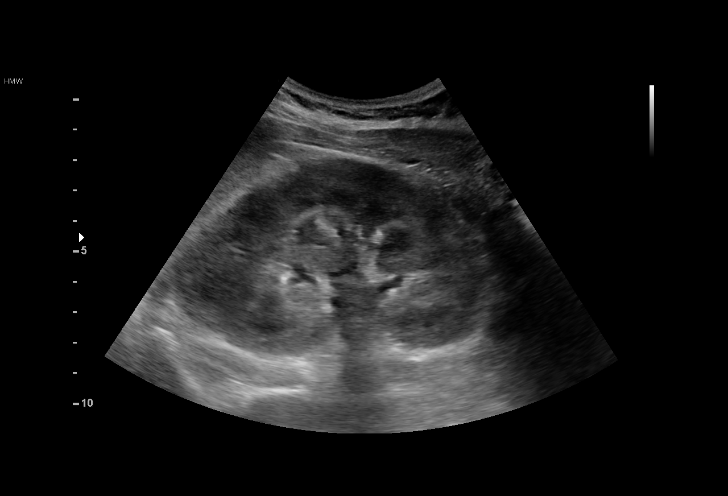
[im 33/40]
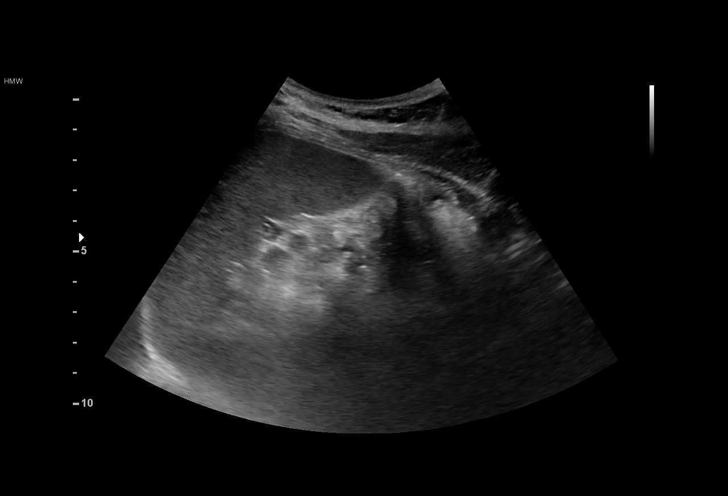
[im 36/40]
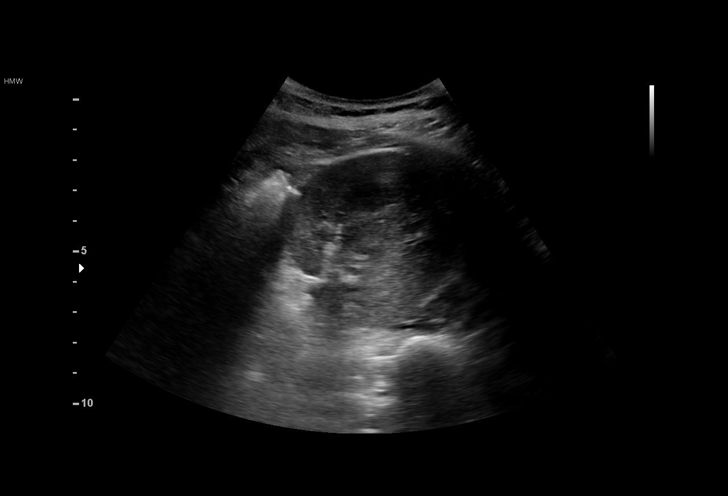
[im 40/40]
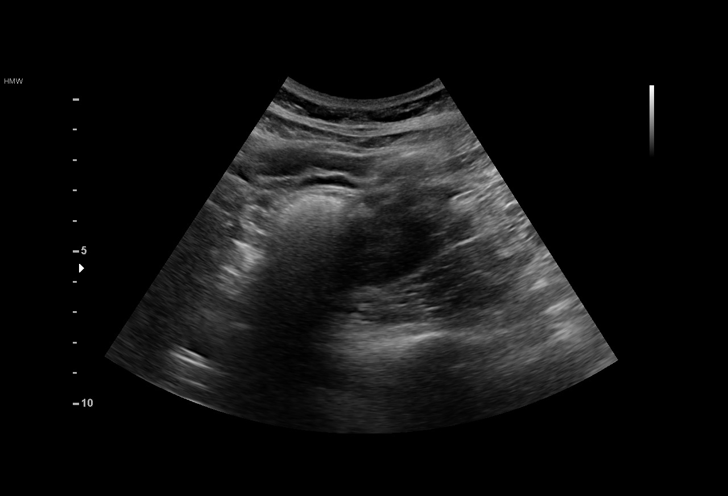

[15 of 25 positions shown; findings below may reference images not displayed]

FINDINGS: Right Kidney:

Length: 11.0 cm. Echogenicity within normal limits. No mass or
hydronephrosis visualized.

Left Kidney:

Length: 10.9 cm. Echogenicity within normal limits. No mass or
hydronephrosis visualized.

Bladder:

Appears normal for degree of bladder distention.
IMPRESSION: Negative exam.

## 2019-05-08 ENCOUNTER — Other Ambulatory Visit: Payer: Self-pay

## 2019-05-08 ENCOUNTER — Emergency Department (HOSPITAL_COMMUNITY)
Admission: EM | Admit: 2019-05-08 | Discharge: 2019-05-08 | Disposition: A | Discharge status: Home / Self Care | Payer: Managed Care, Other (non HMO) | Attending: Emergency Medicine | Admitting: Emergency Medicine

## 2019-05-08 ENCOUNTER — Emergency Department (HOSPITAL_COMMUNITY): Payer: Managed Care, Other (non HMO)

## 2019-05-08 ENCOUNTER — Encounter (HOSPITAL_COMMUNITY): Payer: Self-pay | Admitting: Emergency Medicine

## 2019-05-08 DIAGNOSIS — Y9239 Other specified sports and athletic area as the place of occurrence of the external cause: Secondary | ICD-10-CM | POA: Insufficient documentation

## 2019-05-08 DIAGNOSIS — J45909 Unspecified asthma, uncomplicated: Secondary | ICD-10-CM | POA: Diagnosis not present

## 2019-05-08 DIAGNOSIS — S46812A Strain of other muscles, fascia and tendons at shoulder and upper arm level, left arm, initial encounter: Secondary | ICD-10-CM

## 2019-05-08 DIAGNOSIS — S060X1A Concussion with loss of consciousness of 30 minutes or less, initial encounter: Secondary | ICD-10-CM | POA: Diagnosis not present

## 2019-05-08 DIAGNOSIS — S0990XA Unspecified injury of head, initial encounter: Secondary | ICD-10-CM | POA: Diagnosis present

## 2019-05-08 DIAGNOSIS — Y9344 Activity, trampolining: Secondary | ICD-10-CM | POA: Diagnosis not present

## 2019-05-08 DIAGNOSIS — W010XXA Fall on same level from slipping, tripping and stumbling without subsequent striking against object, initial encounter: Secondary | ICD-10-CM | POA: Diagnosis not present

## 2019-05-08 DIAGNOSIS — Y999 Unspecified external cause status: Secondary | ICD-10-CM | POA: Diagnosis not present

## 2019-05-08 MED ORDER — ACETAMINOPHEN 500 MG PO TABS
1000.0000 mg | ORAL_TABLET | Freq: Once | ORAL | Status: AC
  Administered 2019-05-08: 1000 mg via ORAL
  Filled 2019-05-08: qty 2

## 2019-05-08 MED ORDER — OXYCODONE HCL 5 MG PO TABS
5.0000 mg | ORAL_TABLET | Freq: Once | ORAL | Status: AC
  Administered 2019-05-08: 16:00:00 5 mg via ORAL
  Filled 2019-05-08: qty 1

## 2019-05-08 NOTE — ED Triage Notes (Signed)
Pt in via [facility] from trampoline park, c/o head pain. Per EMS, pt was jumping, and hit non-padded area of rail. C/o L side head and neck pain. Reported LOC, c-collar on, GCS 15 on arrival

## 2019-05-08 NOTE — ED Provider Notes (Signed)
[place] EMERGENCY DEPARTMENT Provider Note   CSN: [id number] Arrival date & time: 05/08/19  1401     History Chief Complaint  Patient presents with  . Fall  . Headache    Yvonne Gentry is a 29 y.o. female.  29 yo F with a chief complaints of a fall.  Patient was at a trampoline park and was jousting with her boyfriend.  She lost her balance and fell backwards and landed between the padding and the wall.  Complaining of a headache and neck pain.  She denies other area of injury.  Did not try and walk afterwards.  Denies extremity pain denies abdominal pain denies chest pain or shortness of breath.  States that her neck pain is worse on the left side.  The history is provided by the patient.  Fall This is a new problem. The current episode started less than 1 hour ago. The problem occurs constantly. The problem has not changed since onset.Associated symptoms include headaches. Pertinent negatives include no chest pain and no shortness of breath. Nothing aggravates the symptoms. Nothing relieves the symptoms. She has tried nothing for the symptoms. The treatment provided no relief.  Headache Associated symptoms: neck pain   Associated symptoms: no congestion, no dizziness, no fever, no myalgias, no nausea and no vomiting        Past Medical History:  Diagnosis Date  . Asthma   . Gallstones   . Panic attack   . Pollen allergies     Patient Active Problem List   Diagnosis Date Noted  . 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 LAPAROSCOPIC TUBAL LIGATION Bilateral 07/12/2016   Procedure: LAPAROSCOPIC TUBAL LIGATION;  Surgeon: Chancy Milroy, MD;  Location: [facility];  Service: Gynecology;  Laterality: Bilateral;  . NO PAST SURGERIES       OB History    Gravida  3   Para  3   Term  3   Preterm  0   AB  0   Living  3     SAB  0   TAB  0   Ectopic  0   Multiple  0   Live Births  3           Family History  Problem Relation Age of Onset  . Hypertension Father   . Diabetes Brother   . Cancer Mother     Social History   Tobacco Use  . Smoking status: Never Smoker  . Smokeless tobacco: Never Used  Substance Use Topics  . Alcohol use: Not Currently  . Drug use: No    Home Medications Prior to Admission medications   Not on File    Allergies    Patient has no known allergies.  Review of Systems   Review of Systems  Constitutional: Negative for chills and fever.  HENT: Negative for congestion and rhinorrhea.   Eyes: Negative for redness and visual disturbance.  Respiratory: Negative for shortness of breath and wheezing.   Cardiovascular: Negative for chest pain and palpitations.  Gastrointestinal: Negative for nausea and vomiting.  Genitourinary: Negative for dysuria and urgency.  Musculoskeletal: Positive for neck pain. Negative for arthralgias and myalgias.  Skin: Negative for pallor and wound.  Neurological: Positive for headaches. Negative for dizziness.    Physical Exam Updated Vital Signs BP 120/61   Pulse 79   Temp 98.1 F (36.7 C)   Resp 16   SpO2 98%  Physical Exam Vitals and nursing note reviewed.  Constitutional:      General: She is not in acute distress.    Appearance: She is well-developed. She is not diaphoretic.  HENT:     Head: Normocephalic and atraumatic.  Eyes:     Pupils: Pupils are equal, round, and reactive to light.  Cardiovascular:     Rate and Rhythm: Normal rate and regular rhythm.     Heart sounds: No murmur. No friction rub. No gallop.   Pulmonary:     Effort: Pulmonary effort is normal.     Breath sounds: No wheezing or rales.  Abdominal:     General: There is no distension.     Palpations: Abdomen is soft.     Tenderness: There is no abdominal tenderness.  Musculoskeletal:        General: No tenderness.     Cervical back: Normal range of motion and neck supple.  Skin:    General: Skin is warm and  dry.  Neurological:     Mental Status: She is alert and oriented to person, place, and time. She is confused.  Psychiatric:        Behavior: Behavior normal.     ED Results / Procedures / Treatments   Labs (all labs ordered are listed, but only abnormal results are displayed) Labs Reviewed - No data to display  EKG None  Radiology CT Head Wo Contrast  Result Date: 05/08/2019 CLINICAL DATA:  Trampoline injury, left-sided head and neck pain, loss of consciousness EXAM: CT HEAD WITHOUT CONTRAST CT CERVICAL SPINE WITHOUT CONTRAST TECHNIQUE: Multidetector CT imaging of the head and cervical spine was performed following the standard protocol without intravenous contrast. Multiplanar CT image reconstructions of the cervical spine were also generated. COMPARISON:  None. FINDINGS: CT HEAD FINDINGS Brain: No acute infarct or hemorrhage. Lateral ventricles and midline structures are unremarkable. No acute extra-axial fluid collections. No mass effect. Vascular: No hyperdense vessel or unexpected calcification. Skull: Normal. Negative for fracture or focal lesion. Sinuses/Orbits: No acute finding. Other: None CT CERVICAL SPINE FINDINGS Alignment: Alignment is grossly anatomic. Skull base and vertebrae: No acute displaced fracture. Soft tissues and spinal canal: No prevertebral fluid or swelling. No visible canal hematoma. Disc levels:  No significant spondylosis. Upper chest: Airway is patent.  Lung apices are clear. Other: Reconstructed images demonstrate no additional findings. IMPRESSION: 1. No acute intracranial process. 2. Unremarkable cervical spine. Electronically Signed   By: Sharlet Salina M.D.   On: 05/08/2019 15:19   CT Cervical Spine Wo Contrast  Result Date: 05/08/2019 CLINICAL DATA:  Trampoline injury, left-sided head and neck pain, loss of consciousness EXAM: CT HEAD WITHOUT CONTRAST CT CERVICAL SPINE WITHOUT CONTRAST TECHNIQUE: Multidetector CT imaging of the head and cervical spine was  performed following the standard protocol without intravenous contrast. Multiplanar CT image reconstructions of the cervical spine were also generated. COMPARISON:  None. FINDINGS: CT HEAD FINDINGS Brain: No acute infarct or hemorrhage. Lateral ventricles and midline structures are unremarkable. No acute extra-axial fluid collections. No mass effect. Vascular: No hyperdense vessel or unexpected calcification. Skull: Normal. Negative for fracture or focal lesion. Sinuses/Orbits: No acute finding. Other: None CT CERVICAL SPINE FINDINGS Alignment: Alignment is grossly anatomic. Skull base and vertebrae: No acute displaced fracture. Soft tissues and spinal canal: No prevertebral fluid or swelling. No visible canal hematoma. Disc levels:  No significant spondylosis. Upper chest: Airway is patent.  Lung apices are clear. Other: Reconstructed images demonstrate no additional findings. IMPRESSION: 1. No  acute intracranial process. 2. Unremarkable cervical spine. Electronically Signed   By: Sharlet Salina M.D.   On: 05/08/2019 15:19    Procedures Procedures (including critical care time)  Medications Ordered in ED Medications  acetaminophen (TYLENOL) tablet 1,000 mg (has no administration in time range)  oxyCODONE (Oxy IR/ROXICODONE) immediate release tablet 5 mg (has no administration in time range)    ED Course  I have reviewed the triage vital signs and the nursing notes.  Pertinent labs & imaging results that were available during my care of the patient were reviewed by me and considered in my medical decision making (see chart for details).    MDM Rules/Calculators/A&P                      29 yo F with a chief complaints of fall.  Patient was at a trampoline park and fell off a jousting platform and landed between some foam padding and a wall.  Patient is a bit confused and slow to respond on my exam.  She smells of marijuana and so this may be the cause of that however will obtain a CT scan of  the head and C-spine.  CT negative.    Will d/c.   3:41 PM:  I have discussed the diagnosis/risks/treatment options with the patient and family and believe the pt to be eligible for discharge home to follow-up with PCP. We also discussed returning to the ED immediately if new or worsening sx occur. We discussed the sx which are most concerning (e.g., sudden worsening pain, fever, inability to tolerate by mouth) that necessitate immediate return. Medications administered to the patient during their visit and any new prescriptions provided to the patient are listed below.  Medications given during this visit Medications  acetaminophen (TYLENOL) tablet 1,000 mg (has no administration in time range)  oxyCODONE (Oxy IR/ROXICODONE) immediate release tablet 5 mg (has no administration in time range)     The patient appears reasonably screen and/or stabilized for discharge and I doubt any other medical condition or other [facility] requiring further screening, evaluation, or treatment in the ED at this time prior to discharge.   Final Clinical Impression(s) / ED Diagnoses Final diagnoses:  Concussion with loss of consciousness of 30 minutes or less, initial encounter  Strain of left trapezius muscle, initial encounter    Rx / DC Orders ED Discharge Orders    None       Melene Plan, DO 05/08/19 1541

## 2019-05-08 NOTE — Discharge Instructions (Signed)
Take 4 over the counter ibuprofen tablets 3 times a day or 2 over-the-counter naproxen tablets twice a day for pain. Also take tylenol 1000mg(2 extra strength) four times a day.    

## 2019-09-07 IMAGING — DX DG TIBIA/FIBULA 2V*L*
3 series · 3 of 3 positions shown · non-contrast
Comparison: Left ankle radiographs November 07, 2017

CLINICAL DATA: Pain following inversion injury

EXAM:
LEFT TIBIA AND FIBULA - 2 VIEW

[tibia ap (1 of 2)]
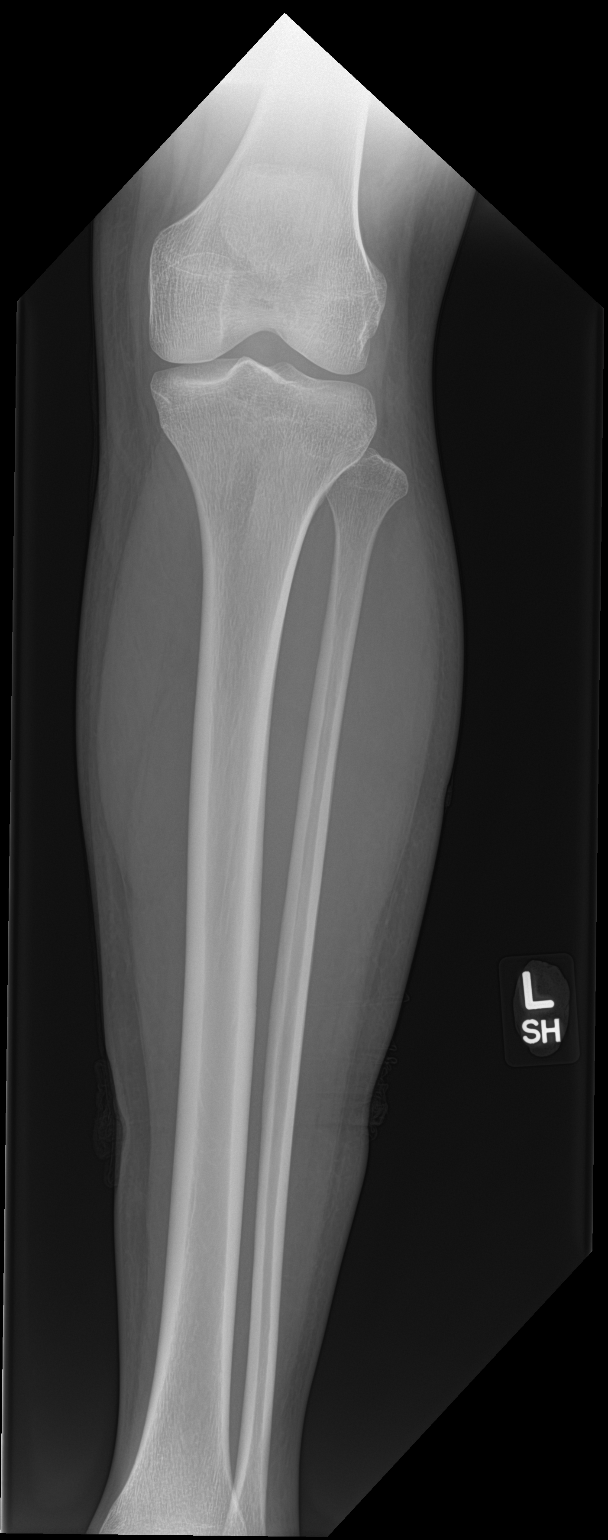

[tibia ap (2 of 2)]
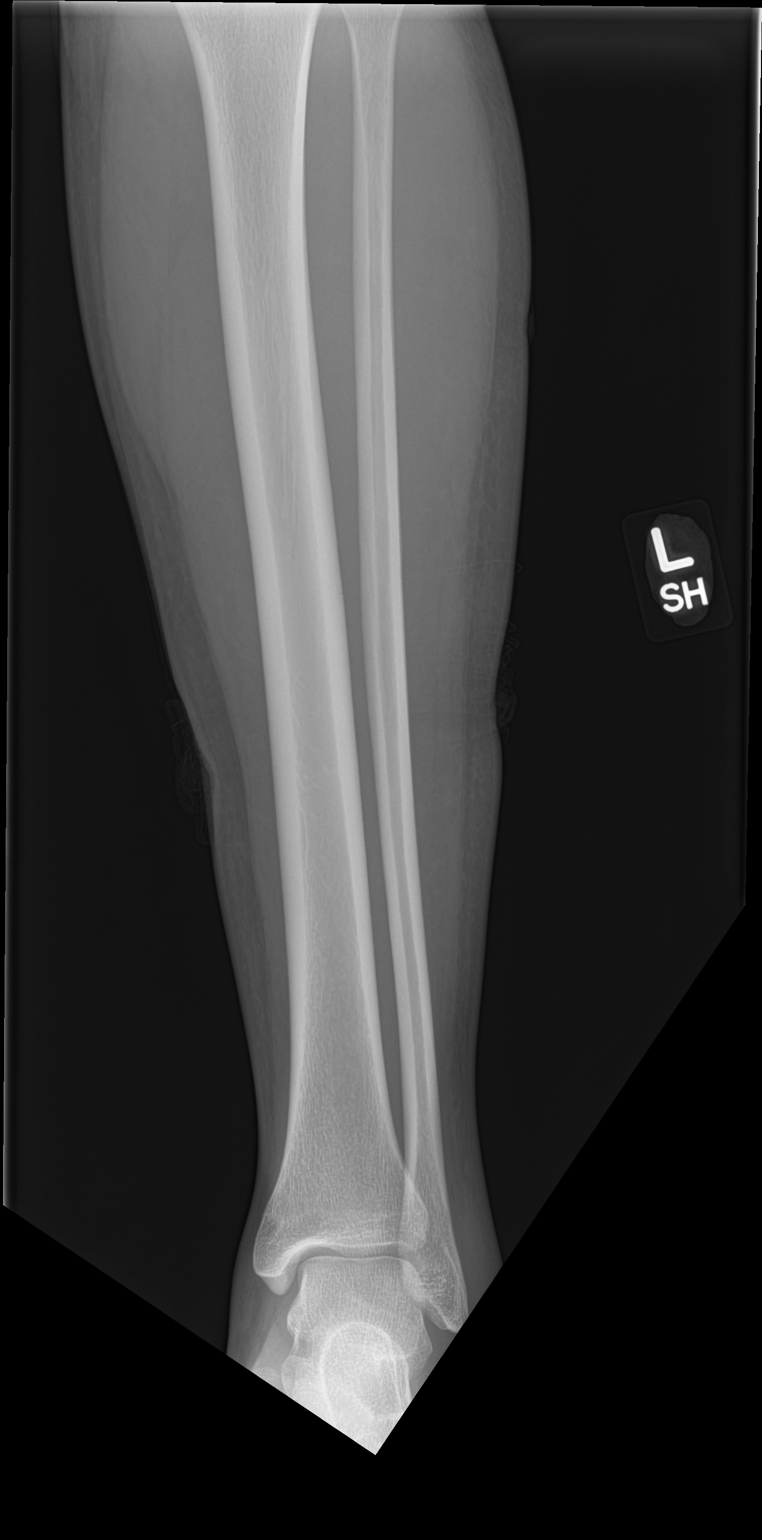

[tibia lat]
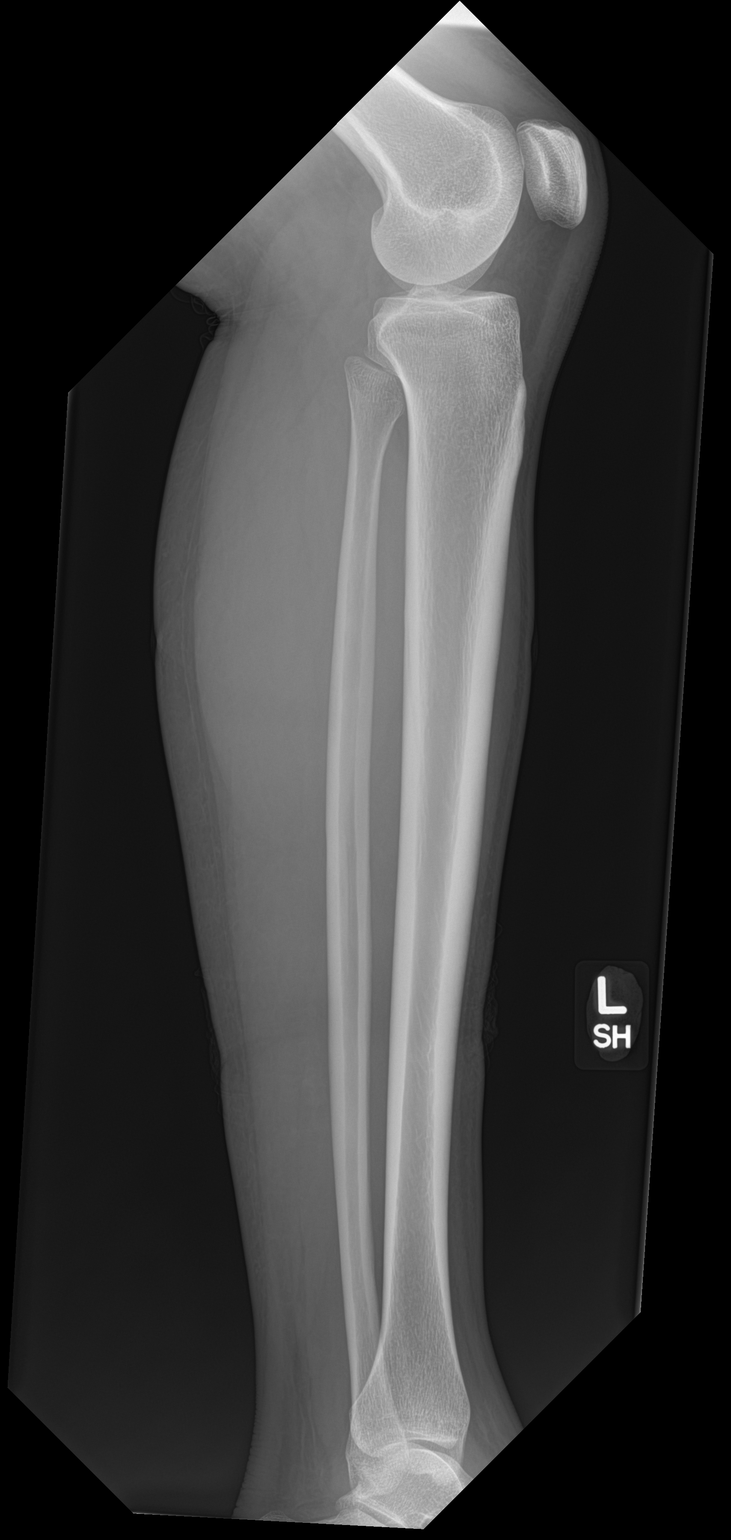

[3 of 3 positions shown; findings below may reference images not displayed]

FINDINGS: Frontal and lateral views were obtained. There is soft tissue
swelling distally. No fracture or dislocation. No knee joint
effusion. Small ankle joint effusion noted. No abnormal periosteal
reaction. No appreciable joint space narrowing or erosion.
IMPRESSION: Ankle joint effusion. No fracture or dislocation. No appreciable
arthropathy.

## 2019-09-07 IMAGING — DX DG FOOT COMPLETE 3+V*L*
3 series · 3 of 3 positions shown · non-contrast
Comparison: None.

CLINICAL DATA: Pain following inversion type injury

EXAM:
LEFT FOOT - COMPLETE 3+ VIEW

[foot ap]
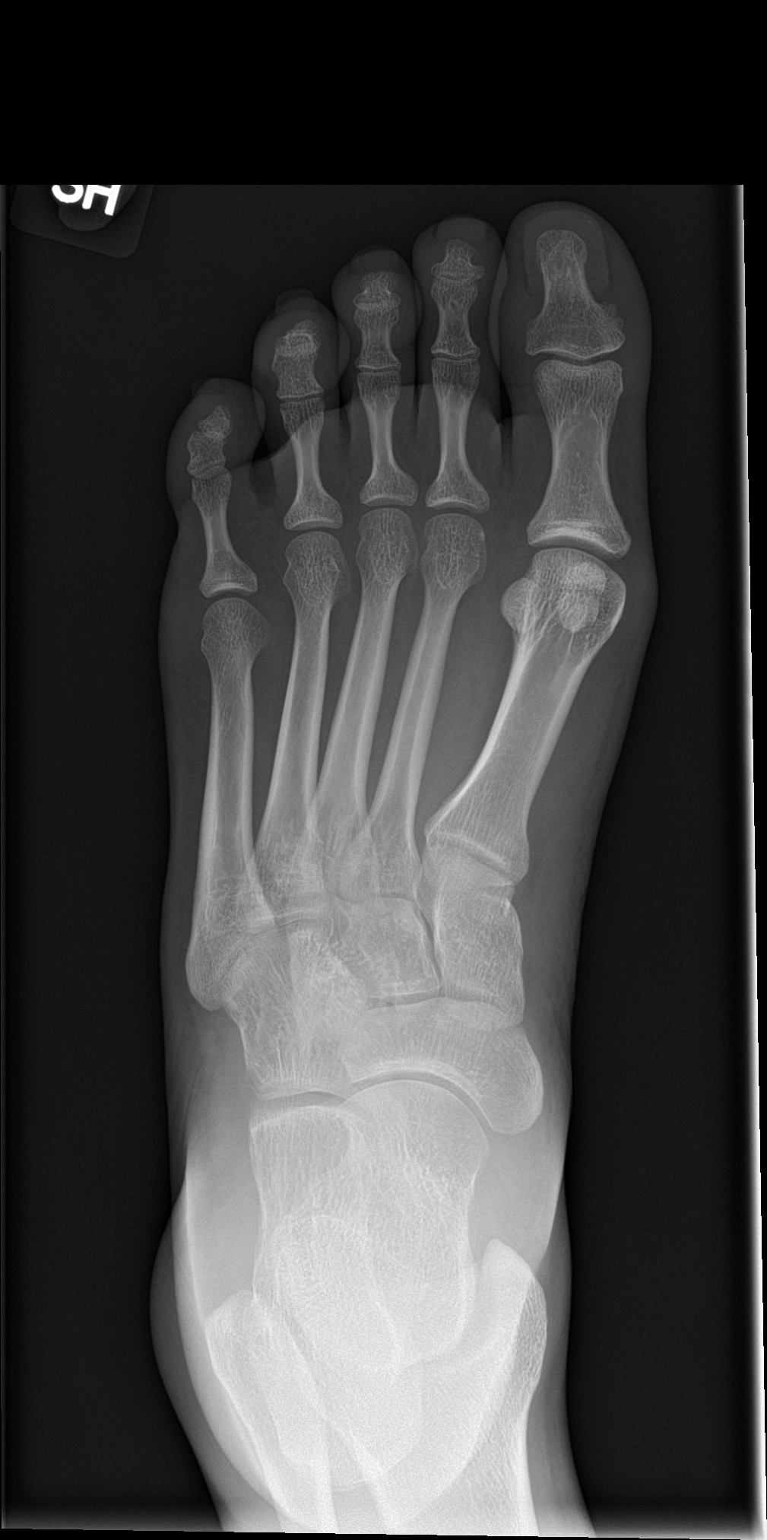

[foot obl]
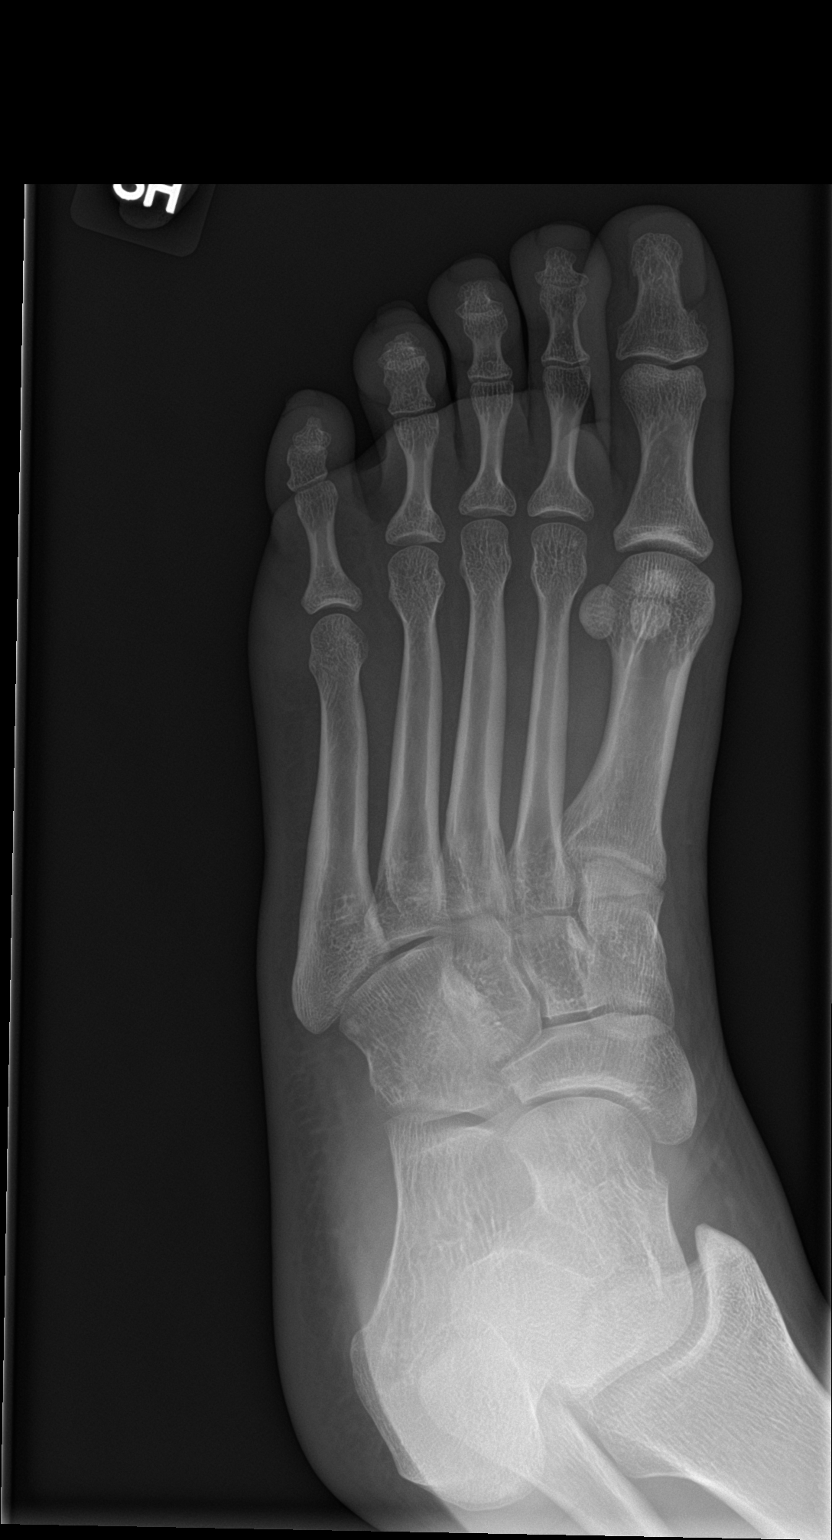

[foot lat]
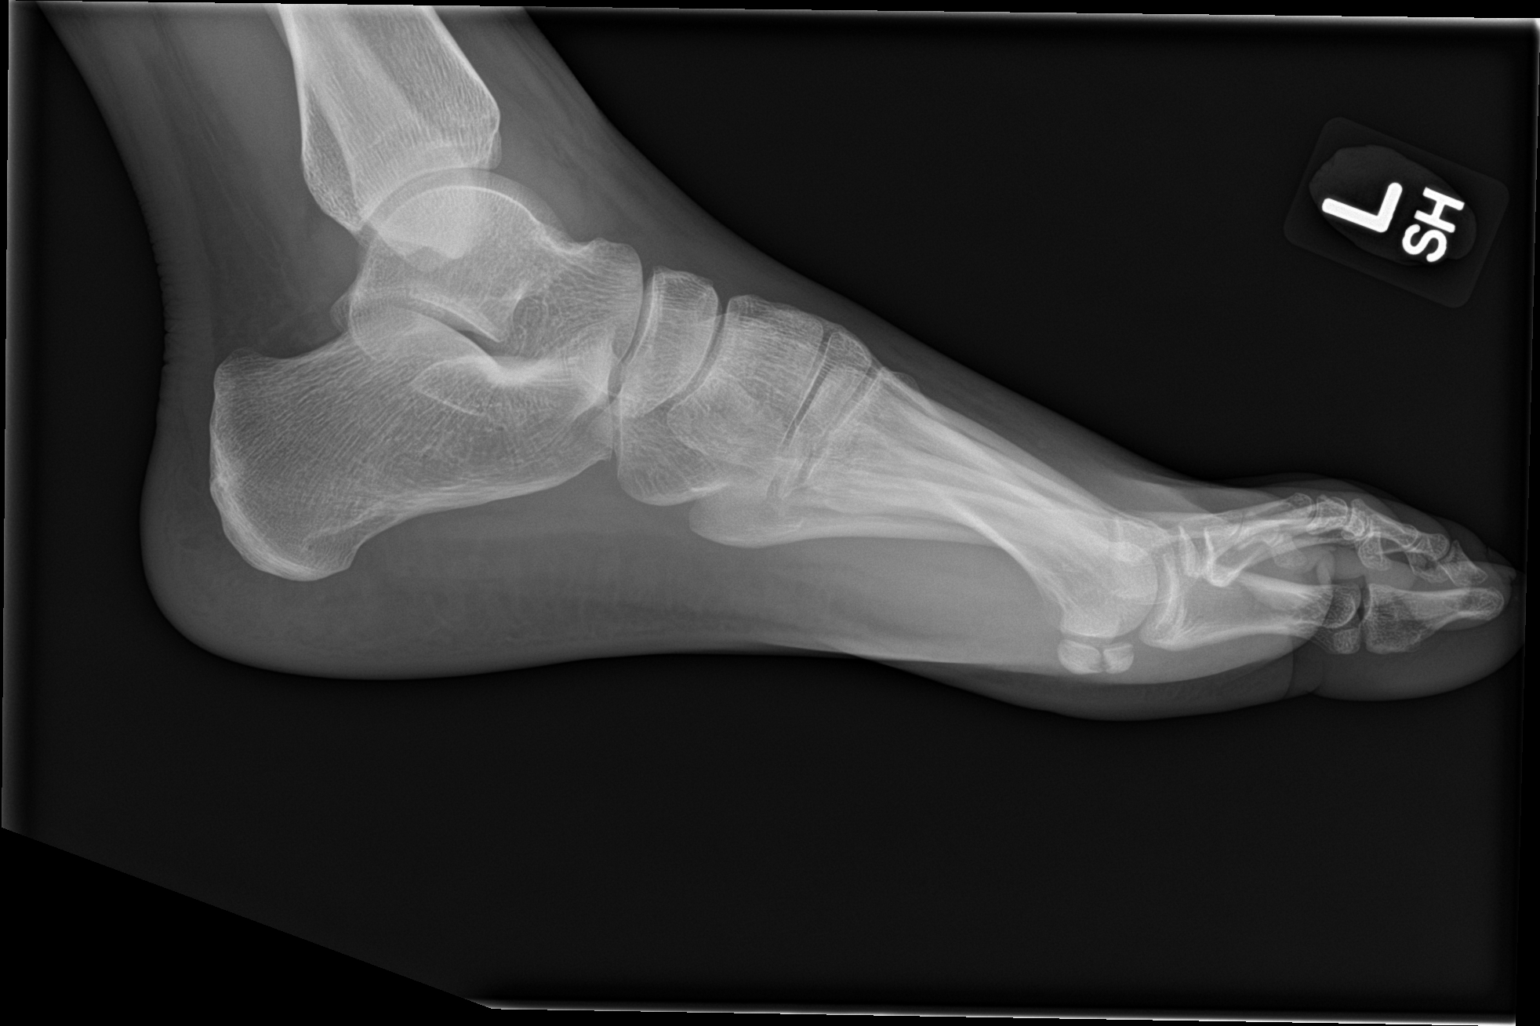

[3 of 3 positions shown; findings below may reference images not displayed]

FINDINGS: Frontal, oblique, and lateral views obtained. No fracture or
dislocation. Joint spaces appear normal. No erosive change.
IMPRESSION: No fracture or dislocation.  No evident arthropathy.

## 2021-01-21 ENCOUNTER — Emergency Department (HOSPITAL_COMMUNITY)
Admission: EM | Admit: 2021-01-21 | Discharge: 2021-01-21 | Disposition: A | Discharge status: Home / Self Care | Payer: Managed Care, Other (non HMO) | Attending: Emergency Medicine | Admitting: Emergency Medicine

## 2021-01-21 ENCOUNTER — Other Ambulatory Visit: Payer: Self-pay

## 2021-01-21 ENCOUNTER — Encounter (HOSPITAL_COMMUNITY): Payer: Self-pay

## 2021-01-21 ENCOUNTER — Emergency Department (HOSPITAL_COMMUNITY): Payer: Managed Care, Other (non HMO)

## 2021-01-21 DIAGNOSIS — J45909 Unspecified asthma, uncomplicated: Secondary | ICD-10-CM | POA: Insufficient documentation

## 2021-01-21 DIAGNOSIS — N9489 Other specified conditions associated with female genital organs and menstrual cycle: Secondary | ICD-10-CM | POA: Diagnosis not present

## 2021-01-21 DIAGNOSIS — D72829 Elevated white blood cell count, unspecified: Secondary | ICD-10-CM | POA: Insufficient documentation

## 2021-01-21 DIAGNOSIS — R300 Dysuria: Secondary | ICD-10-CM | POA: Diagnosis not present

## 2021-01-21 DIAGNOSIS — R102 Pelvic and perineal pain: Secondary | ICD-10-CM | POA: Diagnosis not present

## 2021-01-21 DIAGNOSIS — N938 Other specified abnormal uterine and vaginal bleeding: Secondary | ICD-10-CM | POA: Insufficient documentation

## 2021-01-21 DIAGNOSIS — A599 Trichomoniasis, unspecified: Secondary | ICD-10-CM

## 2021-01-21 DIAGNOSIS — N898 Other specified noninflammatory disorders of vagina: Secondary | ICD-10-CM | POA: Diagnosis not present

## 2021-01-21 LAB — URINALYSIS, MICROSCOPIC (REFLEX)

## 2021-01-21 LAB — COMPREHENSIVE METABOLIC PANEL
ALT: 12 U/L (ref 0–44)
AST: 16 U/L (ref 15–41)
Albumin: 3.7 g/dL (ref 3.5–5.0)
Alkaline Phosphatase: 41 U/L (ref 38–126)
Anion gap: 4 — ABNORMAL LOW (ref 5–15)
BUN: 9 mg/dL (ref 6–20)
CO2: 25 mmol/L (ref 22–32)
Calcium: 8.8 mg/dL — ABNORMAL LOW (ref 8.9–10.3)
Chloride: 108 mmol/L (ref 98–111)
Creatinine, Ser: 0.72 mg/dL (ref 0.44–1.00)
GFR, Estimated: >60 mL/min (ref >60)
Glucose, Bld: 101 mg/dL — ABNORMAL HIGH (ref 70–99)
Potassium: 4.5 mmol/L (ref 3.5–5.1)
Sodium: 137 mmol/L (ref 135–145)
Total Bilirubin: 0.8 mg/dL (ref 0.3–1.2)
Total Protein: 6.9 g/dL (ref 6.5–8.1)

## 2021-01-21 LAB — WET PREP, GENITAL
Clue Cells Wet Prep HPF POC: NONE SEEN
Sperm: NONE SEEN
WBC, Wet Prep HPF POC: >=10 — AB (ref <10)
Yeast Wet Prep HPF POC: NONE SEEN

## 2021-01-21 LAB — CBC WITH DIFFERENTIAL/PLATELET
Abs Immature Granulocytes: 0.01 10*3/uL (ref 0.00–0.07)
Basophils Absolute: 0 10*3/uL (ref 0.0–0.1)
Basophils Relative: 0 %
Eosinophils Absolute: 0.2 10*3/uL (ref 0.0–0.5)
Eosinophils Relative: 3 %
HCT: 42.4 % (ref 36.0–46.0)
Hemoglobin: 14.2 g/dL (ref 12.0–15.0)
Immature Granulocytes: 0 %
Lymphocytes Relative: 39 %
Lymphs Abs: 1.8 10*3/uL (ref 0.7–4.0)
MCH: 29.8 pg (ref 26.0–34.0)
MCHC: 33.5 g/dL (ref 30.0–36.0)
MCV: 89.1 fL (ref 80.0–100.0)
Monocytes Absolute: 0.6 10*3/uL (ref 0.1–1.0)
Monocytes Relative: 12 %
Neutro Abs: 2.2 10*3/uL (ref 1.7–7.7)
Neutrophils Relative %: 46 %
Platelets: 260 10*3/uL (ref 150–400)
RBC: 4.76 MIL/uL (ref 3.87–5.11)
RDW: 11.9 % (ref 11.5–15.5)
WBC: 4.8 10*3/uL (ref 4.0–10.5)
nRBC: 0 % (ref 0.0–0.2)

## 2021-01-21 LAB — URINALYSIS, ROUTINE W REFLEX MICROSCOPIC
Bilirubin Urine: NEGATIVE
Glucose, UA: NEGATIVE mg/dL
Ketones, ur: NEGATIVE mg/dL
Nitrite: NEGATIVE
Protein, ur: NEGATIVE mg/dL
Specific Gravity, Urine: 1.02 (ref 1.005–1.030)
pH: 7 (ref 5.0–8.0)

## 2021-01-21 LAB — I-STAT BETA HCG BLOOD, ED (MC, WL, AP ONLY): I-stat hCG, quantitative: <5 m[IU]/mL (ref <5)

## 2021-01-21 MED ORDER — METRONIDAZOLE 500 MG PO TABS: 2000.0000 mg | ORAL_TABLET | Freq: Once | ORAL | 0 refills | Status: AC

## 2021-01-21 MED ORDER — METRONIDAZOLE 500 MG PO TABS
2000.0000 mg | ORAL_TABLET | Freq: Once | ORAL | Status: AC
  Administered 2021-01-21: 17:00:00 2000 mg via ORAL
  Filled 2021-01-21: qty 4

## 2021-01-21 NOTE — ED Provider Notes (Signed)
Emergency Medicine Provider Triage Evaluation Note  Yvonne Gentry , a 30 y.o. female  was evaluated in triage.  Pt complains of vaginal bleeding and abdominal pressure.  She has noticed this for the past 3 times following intercourse.  Also reports feeling like she has urinary frequency.  She does report some vaginal discharge although she is unsure if this is due to the bleeding that she is having.  She does not feel that this is related to her usual menstrual cycle as it has been happening after intercourse.  States that she will have pressure in her lower back as well.  Denies any fevers or shortness of breath.  Review of Systems  Positive: Vaginal bleeding, pelvic pain Negative: Fever  Physical Exam  BP (!) 128/112    Pulse 93    Temp 99.3 F (37.4 C) (Oral)    Resp 16    SpO2 100%  Gen:   Awake, no distress   Resp:  Normal effort  MSK:   Moves extremities without difficulty  Other:  Abdomen is soft  Medical Decision Making  Medically screening exam initiated at 9:43 AM.  Appropriate orders placed.  Yvonne Gentry was informed that the remainder of the evaluation will be completed by another provider, this initial triage assessment does not replace that evaluation, and the importance of remaining in the ED until their evaluation is complete.  Labs and imaging ordered   Yvonne Pates, PA-C 01/21/21 3300    Yvonne Fines, MD 01/23/21 [id number]

## 2021-01-21 NOTE — Discharge Instructions (Addendum)
You tested positive for trichmonas infection.  We treated you in the ER.  Please avoid sexual contact with your partners until you have confirmed your results for Chlamydia and Gonorrhea - you can check the results online daily.  They usually take 3-5 days to result.  If you are positive you will be directed for treatment for these infections, and told your partner needs to be treated as well.  *  General Information about STI testing:  Please take a few minutes to read these important discharge instructions below:  You have been diagnosed with and treated for a presumed sexually transmitted infection such as chlamydia, gonorrhea, or trichomoniasis.  Chlamydia and gonorrhea are the most commonly reported communicable diseases in the [place]. They are especially common among people younger than 30 years of age.  Infection without symptoms is common in men and women. Your partner may be infected but not display symptoms.  Complications of these infections without treatment include serious pelvic infections, ectopic pregnancy, and infertility. Other infections such as HIV or HPV, which can occur in patients with sexually transmitted infections, can lead to cancer or death.  To minimize the spread of the infection, you should avoid sexual intercourse for 7 days or until symptoms have resolved.  To minimize the spread of the infection, you should advise your sexual partner(s) to be evaluated, tested and treated. This includes all sexual partners within the past 60 days or your last sexual partner if last contact was greater than 60 days.  To minimize the risk of reinfection, you should abstain from sexual intercourse until your sexual partners have been tested and treated.  Consistent condom use is important in preventing the spread of sexually transmitted infections.  Do you need to get re-tested? Studies reveal that infection with chlamydia or gonorrhea is common among those treated  within the preceding several months. Testing for gonorrhea and chlamydia in approximately 3 months is recommended even if you believe your sexual partner has been treated. Based on your history or demographics, your doctor may feel that you are higher risk for HIV and syphilis.  Talk to your doctor about whether you should be tested for these diseases at this time.  When should you come back to the Emergency Department? If your symptoms have not improved after 48 hours from when you received your antibiotics, you should contact your primary care doctor or return to the Emergency Department.  These may be signs of an infection that has not been fully treated. If you develop worsening pain, worsening discharge from your penis or vagina, fevers of greater than 100.4 F, or have any other new or alarming symptoms, you should return promptly to the Emergency Department.  You should follow up with your primary care doctors office in 1 week to ensure that your symptoms have resolved.

## 2021-01-21 NOTE — ED Provider Notes (Signed)
[facility] EMERGENCY DEPARTMENT Provider Note   CSN: [id number] Arrival date & time: 01/21/21  1749     History Chief Complaint  Patient presents with   Vaginal Bleeding    Yvonne Gentry is a 30 y.o. female presenting to the emergency department with vaginal discharge and bleeding.  The patient ports been ongoing for several days.  She reports specifically after intercourse, she notes that she has some dark, bloody appearing discharge from the vagina, and also some cramping discomfort.  She reports pressure with urination.  She says this is not related to her normal menstrual cycle.  She denies persistent spotting or bleeding in between.  She reports that at times she feels that there is some "clear looking discharge" from the vagina.  She denies history of STI.  She reports she has been at the same female partner.  He does not have any active symptoms.  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    Past Surgical History:  Procedure Laterality Date   LAPAROSCOPIC TUBAL LIGATION Bilateral 07/12/2016   Procedure: LAPAROSCOPIC TUBAL LIGATION;  Surgeon: Hermina Staggers, MD;  Location: [facility];  Service: Gynecology;  Laterality: Bilateral;   NO PAST SURGERIES       OB History     Gravida  3   Para  3   Term  3   Preterm  0   AB  0   Living  3      SAB  0   IAB  0   Ectopic  0   Multiple  0   Live Births  3           Family History  Problem Relation Age of Onset   Hypertension Father    Diabetes Brother    Cancer Mother     Social History   Tobacco Use   Smoking status: Never   Smokeless tobacco: Never  Vaping Use   Vaping Use: Never used  Substance Use Topics   Alcohol use: Not Currently   Drug use: No    Home Medications Prior to Admission medications   Medication  Sig Start Date End Date Taking? Authorizing Provider  metroNIDAZOLE (FLAGYL) 500 MG tablet Take 4 tablets (2,000 mg total) by mouth once for 1 dose. 01/21/21 01/21/21 Yes Tyrin Herbers, Kermit Balo, MD    Allergies    Patient has no known allergies.  Review of Systems   Review of Systems  Constitutional:  Negative for chills and fever.  Respiratory:  Negative for cough and shortness of breath.   Cardiovascular:  Negative for chest pain and palpitations.  Gastrointestinal:  Negative for abdominal pain and vomiting.  Genitourinary:  Positive for dysuria, vaginal bleeding and vaginal discharge. Negative for menstrual problem and pelvic pain.  Musculoskeletal:  Negative for arthralgias and back pain.  Skin:  Negative for color change and rash.  Neurological:  Negative for syncope and headaches.  All other systems reviewed and are negative.  Physical Exam Updated Vital Signs BP 116/71 (BP Location: Right Arm)    Pulse 79    Temp 98.7 F (37.1 C) (Oral)    Resp 18    SpO2 100%   Physical Exam Constitutional:      General: She is not  in acute distress. HENT:     Head: Normocephalic and atraumatic.  Eyes:     Conjunctiva/sclera: Conjunctivae normal.     Pupils: Pupils are equal, round, and reactive to light.  Cardiovascular:     Rate and Rhythm: Normal rate and regular rhythm.  Pulmonary:     Effort: Pulmonary effort is normal. No respiratory distress.  Abdominal:     General: There is no distension.     Tenderness: There is no abdominal tenderness.  Skin:    General: Skin is warm and dry.  Neurological:     General: No focal deficit present.     Mental Status: She is alert. Mental status is at baseline.  Psychiatric:        Mood and Affect: Mood normal.        Behavior: Behavior normal.    ED Results / Procedures / Treatments   Labs (all labs ordered are listed, but only abnormal results are displayed) Labs Reviewed  WET PREP, GENITAL - Abnormal; Notable for the following  components:      Result Value   Trich, Wet Prep PRESENT (*)    WBC, Wet Prep HPF POC >=10 (*)    All other components within normal limits  COMPREHENSIVE METABOLIC PANEL - Abnormal; Notable for the following components:   Glucose, Bld 101 (*)    Calcium 8.8 (*)    Anion gap 4 (*)    All other components within normal limits  URINALYSIS, ROUTINE W REFLEX MICROSCOPIC - Abnormal; Notable for the following components:   APPearance HAZY (*)    Hgb urine dipstick TRACE (*)    Leukocytes,Ua TRACE (*)    All other components within normal limits  URINALYSIS, MICROSCOPIC (REFLEX) - Abnormal; Notable for the following components:   Bacteria, UA RARE (*)    All other components within normal limits  CBC WITH DIFFERENTIAL/PLATELET  I-STAT BETA HCG BLOOD, ED ([facility], [facility], [facility] ONLY)  GC/CHLAMYDIA PROBE AMP ([facility]) NOT AT [facility]    EKG None  Radiology US PELVIC COMPLETE W TRANSVAGINAL AND TORSION R/O  Result Date: 01/21/2021 CLINICAL DATA:  Pelvic pain EXAM: TRANSABDOMINAL AND TRANSVAGINAL ULTRASOUND OF PELVIS DOPPLER ULTRASOUND OF OVARIES TECHNIQUE: Both transabdominal and transvaginal ultrasound examinations of the pelvis were performed. Transabdominal technique was performed for global imaging of the pelvis including uterus, ovaries, adnexal regions, and pelvic cul-de-sac. It was necessary to proceed with endovaginal exam following the transabdominal exam to visualize the ovaries and endometrium. Color and duplex Doppler ultrasound was utilized to evaluate blood flow to the ovaries. COMPARISON:  Ob ultrasound 08/14/2015 FINDINGS: Uterus Measurements: 9 x 5 x 5 cm = volume: 120 mL. No fibroids or other mass visualized. Endometrium Thickness: 9 mm.  No focal abnormality visualized. Right ovary Measurements: 33 x 24 x 23 mm = volume: 10 mL. Asymmetry from a probable corpus appearance in the ovary (crenulated rounded area with accentuated peripheral flow). Left ovary Measurements: 31 x 11 x 20 mm =  volume: 4 mL. Normal appearance/no adnexal mass. Pulsed Doppler evaluation of both ovaries demonstrates normal low-resistance arterial and venous waveforms. Other findings Trace, simple pelvic fluid. IMPRESSION: 1. No acute finding.  Normal ovarian blood flow. 2. Trace pelvic fluid, usually physiologic. Electronically Signed   By: Tiburcio Pea M.D.   On: 01/21/2021 11:35    Procedures Procedures   Medications Ordered in ED Medications  metroNIDAZOLE (FLAGYL) tablet 2,000 mg (2,000 mg Oral Given 01/21/21 1642)    ED Course  I have  reviewed the triage vital signs and the nursing notes.  Pertinent labs & imaging results that were available during my care of the patient were reviewed by me and considered in my medical decision making (see chart for details).  Patient presenting with vaginal discharge and bleeding after intercourse.  Also some cramping issues and dysuria.  Differential diagnosis would include STI versus pelvic infection versus UTI versus other.  I reviewed the patient's blood test and ultrasound performed from triage evaluation.  Blood tests are unremarkable.  Hemoglobin normal at baseline.  White blood cell count normal.  Pregnancy is negative.  Ultrasound is structurally unremarkable.  I have a lower suspicion for sepsis or PID.  UA with only trace leukocytes, negative nitrites, no clear evidence of UTI.  Her pelvic exam is notable for discharge, wet prep with +Trich.  Will treat with flagyl.  Clinical Course as of 01/21/21 2331  Wed Jan 21, 2021  1559 Pelvic performed with chaperone present, moderate amount of discharge, green/yellow in vaginal vault with trace blood, erythema of the cervix, will sent wet prep. [MT]  1623 Trich, Wet Prep(!): PRESENT [MT]  1625 BV positive, will treat here with 2 g dose, discussed with patient and can offer 2 g dose for empiric treatment of partner as well, advised sexual abstinence, they will f/u on Chlamydia/GC results. [MT]     Clinical Course User Index [MT] Lashunta Frieden, Kermit Balo, MD      Final Clinical Impression(s) / ED Diagnoses Final diagnoses:  Pelvic pain  Trichomonas infection    Rx / DC Orders ED Discharge Orders          Ordered    metroNIDAZOLE (FLAGYL) 500 MG tablet   Once        01/21/21 1631             Terald Sleeper, MD 01/21/21 [phone]

## 2021-01-21 NOTE — ED Triage Notes (Signed)
Patient complains of vaginal bleeding and abdominal pressure following intercourse, reports urinary frequency with same. No discharge, no nausea, no vomiting

## 2021-01-22 LAB — GC/CHLAMYDIA PROBE AMP (~~LOC~~) NOT AT ARMC
Chlamydia: NEGATIVE
Comment: NEGATIVE
Comment: NORMAL
Neisseria Gonorrhea: NEGATIVE

## 2022-02-10 ENCOUNTER — Encounter (HOSPITAL_BASED_OUTPATIENT_CLINIC_OR_DEPARTMENT_OTHER): Payer: Self-pay

## 2022-02-10 ENCOUNTER — Other Ambulatory Visit: Payer: Self-pay

## 2022-02-10 ENCOUNTER — Emergency Department (HOSPITAL_BASED_OUTPATIENT_CLINIC_OR_DEPARTMENT_OTHER)
Admission: EM | Admit: 2022-02-10 | Discharge: 2022-02-10 | Disposition: A | Discharge status: Home / Self Care | Payer: BC Managed Care – PPO | Attending: Emergency Medicine | Admitting: Emergency Medicine

## 2022-02-10 DIAGNOSIS — R112 Nausea with vomiting, unspecified: Secondary | ICD-10-CM | POA: Diagnosis present

## 2022-02-10 DIAGNOSIS — J45909 Unspecified asthma, uncomplicated: Secondary | ICD-10-CM | POA: Diagnosis not present

## 2022-02-10 DIAGNOSIS — Z1152 Encounter for screening for COVID-19: Secondary | ICD-10-CM | POA: Insufficient documentation

## 2022-02-10 DIAGNOSIS — J101 Influenza due to other identified influenza virus with other respiratory manifestations: Secondary | ICD-10-CM

## 2022-02-10 LAB — COMPREHENSIVE METABOLIC PANEL
ALT: 12 U/L (ref 0–44)
AST: 22 U/L (ref 15–41)
Albumin: 3.9 g/dL (ref 3.5–5.0)
Alkaline Phosphatase: 44 U/L (ref 38–126)
Anion gap: 12 (ref 5–15)
BUN: 15 mg/dL (ref 6–20)
CO2: 21 mmol/L — ABNORMAL LOW (ref 22–32)
Calcium: 8.3 mg/dL — ABNORMAL LOW (ref 8.9–10.3)
Chloride: 103 mmol/L (ref 98–111)
Creatinine, Ser: 0.62 mg/dL (ref 0.44–1.00)
GFR, Estimated: >60 mL/min (ref >60)
Glucose, Bld: 79 mg/dL (ref 70–99)
Potassium: 3.6 mmol/L (ref 3.5–5.1)
Sodium: 136 mmol/L (ref 135–145)
Total Bilirubin: 0.5 mg/dL (ref 0.3–1.2)
Total Protein: 7.2 g/dL (ref 6.5–8.1)

## 2022-02-10 LAB — RESP PANEL BY RT-PCR (RSV, FLU A&B, COVID)  RVPGX2
Influenza A by PCR: POSITIVE — AB
Influenza B by PCR: NEGATIVE
Resp Syncytial Virus by PCR: NEGATIVE
SARS Coronavirus 2 by RT PCR: NEGATIVE

## 2022-02-10 LAB — CBC WITH DIFFERENTIAL/PLATELET
Abs Immature Granulocytes: 0.01 10*3/uL (ref 0.00–0.07)
Basophils Absolute: 0 10*3/uL (ref 0.0–0.1)
Basophils Relative: 0 %
Eosinophils Absolute: 0 10*3/uL (ref 0.0–0.5)
Eosinophils Relative: 0 %
HCT: 41.2 % (ref 36.0–46.0)
Hemoglobin: 14 g/dL (ref 12.0–15.0)
Immature Granulocytes: 0 %
Lymphocytes Relative: 20 %
Lymphs Abs: 0.8 10*3/uL (ref 0.7–4.0)
MCH: 28.9 pg (ref 26.0–34.0)
MCHC: 34 g/dL (ref 30.0–36.0)
MCV: 85.1 fL (ref 80.0–100.0)
Monocytes Absolute: 1 10*3/uL (ref 0.1–1.0)
Monocytes Relative: 25 %
Neutro Abs: 2.1 10*3/uL (ref 1.7–7.7)
Neutrophils Relative %: 55 %
Platelets: 165 10*3/uL (ref 150–400)
RBC: 4.84 MIL/uL (ref 3.87–5.11)
RDW: 11.8 % (ref 11.5–15.5)
WBC: 3.9 10*3/uL — ABNORMAL LOW (ref 4.0–10.5)
nRBC: 0 % (ref 0.0–0.2)

## 2022-02-10 LAB — HCG, QUANTITATIVE, PREGNANCY: hCG, Beta Chain, Quant, S: <1 m[IU]/mL (ref <5)

## 2022-02-10 MED ORDER — ONDANSETRON HCL 4 MG PO TABS: 4.0000 mg | ORAL_TABLET | Freq: Four times a day (QID) | ORAL | 0 refills | Status: AC

## 2022-02-10 MED ORDER — SODIUM CHLORIDE 0.9 % IV BOLUS
1000.0000 mL | Freq: Once | INTRAVENOUS | Status: AC
Start: 2022-02-10 — End: 2022-02-10
  Administered 2022-02-10: 1000 mL via INTRAVENOUS

## 2022-02-10 MED ORDER — DIPHENHYDRAMINE HCL 50 MG/ML IJ SOLN
25.0000 mg | Freq: Once | INTRAMUSCULAR | Status: AC
  Administered 2022-02-10: 25 mg via INTRAVENOUS
  Filled 2022-02-10: qty 1

## 2022-02-10 MED ORDER — METOCLOPRAMIDE HCL 5 MG/ML IJ SOLN
10.0000 mg | Freq: Once | INTRAMUSCULAR | Status: AC
  Administered 2022-02-10: 10 mg via INTRAVENOUS
  Filled 2022-02-10: qty 2

## 2022-02-10 MED ORDER — KETOROLAC TROMETHAMINE 15 MG/ML IJ SOLN
15.0000 mg | Freq: Once | INTRAMUSCULAR | Status: AC
  Administered 2022-02-10: 15 mg via INTRAVENOUS
  Filled 2022-02-10: qty 1

## 2022-02-10 NOTE — ED Provider Notes (Signed)
[facility] EMERGENCY DEPARTMENT Provider Note   CSN: [id number] Arrival date & time: 02/10/22  0847     History  Chief Complaint  Patient presents with   Vomiting    Yvonne Gentry is a 32 y.o. female, hx of asthma, who presents to the ED 2/2 to fever, chills, cough, diffuse abdominal pain, nausea and vomitingx3 days. Reports started NYE. Reports sore throat. Has been unable to to tolerate PO Intake x2 days. Is no longer making urine.  Denies chest pain, or shortness of breath.  Has been taking TheraFlu, Tylenol and ibuprofen without relief of symptoms.     Home Medications Prior to Admission medications   Medication Sig Start Date End Date Taking? Authorizing Provider  ondansetron (ZOFRAN) 4 MG tablet Take 1 tablet (4 mg total) by mouth every 6 (six) hours. 02/10/22  Yes Caleigh Rabelo L, PA      Allergies    Patient has no known allergies.    Review of Systems   Review of Systems  Gastrointestinal:  Positive for abdominal pain, nausea and vomiting. Negative for diarrhea.    Physical Exam Updated Vital Signs BP 113/84   Pulse 71   Temp 98.2 F (36.8 C) (Oral)   Resp 18   Ht 5\' 5"  (1.651 m)   Wt 56.7 kg   LMP 01/27/2022 (Approximate)   SpO2 96%   BMI 20.80 kg/m  Physical Exam Vitals and nursing note reviewed.  Constitutional:      General: She is not in acute distress.    Appearance: She is well-developed.  HENT:     Head: Normocephalic and atraumatic.     Mouth/Throat:     Mouth: Mucous membranes are dry.  Eyes:     Conjunctiva/sclera: Conjunctivae normal.  Cardiovascular:     Rate and Rhythm: Normal rate and regular rhythm.     Heart sounds: No murmur heard. Pulmonary:     Effort: Pulmonary effort is normal. No respiratory distress.     Breath sounds: Normal breath sounds.  Abdominal:     Palpations: Abdomen is soft.     Tenderness: There is abdominal tenderness. There is no guarding or rebound.  Musculoskeletal:        General: No  swelling.     Cervical back: Neck supple.  Skin:    General: Skin is warm and dry.     Capillary Refill: Capillary refill takes less than 2 seconds.  Neurological:     Mental Status: She is alert.  Psychiatric:        Mood and Affect: Mood normal.     ED Results / Procedures / Treatments   Labs (all labs ordered are listed, but only abnormal results are displayed) Labs Reviewed  RESP PANEL BY RT-PCR (RSV, FLU A&B, COVID)  RVPGX2 - Abnormal; Notable for the following components:      Result Value   Influenza A by PCR POSITIVE (*)    All other components within normal limits  CBC WITH DIFFERENTIAL/PLATELET - Abnormal; Notable for the following components:   WBC 3.9 (*)    All other components within normal limits  COMPREHENSIVE METABOLIC PANEL - Abnormal; Notable for the following components:   CO2 21 (*)    Calcium 8.3 (*)    All other components within normal limits  HCG, QUANTITATIVE, PREGNANCY    EKG None  Radiology No results found.  Procedures Procedures    Medications Ordered in ED Medications  sodium chloride 0.9 % bolus 1,000 mL (  1,000 mLs Intravenous New Bag/Given 02/10/22 0945)  ketorolac (TORADOL) 15 MG/ML injection 15 mg (15 mg Intravenous Given 02/10/22 0946)  metoCLOPramide (REGLAN) injection 10 mg (10 mg Intravenous Given 02/10/22 0945)  diphenhydrAMINE (BENADRYL) injection 25 mg (25 mg Intravenous Given 02/10/22 0946)    ED Course/ Medical Decision Making/ A&P                           Medical Decision Making Patient is a 32 year old female, here for nausea, vomiting, diarrhea, cough for the last 3 to 4 days.  She is just been feeling poor, she states that she is not able to make any urine, we will obtain labs, to evaluate for AKI, COVID/flu for further evaluation.  We will give her IV fluids, Toradol, Reglan, and Benadryl to help with her symptoms.  And reevaluate.  Amount and/or Complexity of Data Reviewed Labs: ordered.    Details: No evidence of  dehydration including elevated BUN.  Flu positive Discussion of management or test interpretation with external provider(s): Discussed results with patient, she is feeling better able to tolerate p.o. intake.  Sent Zofran to pharmacy.  Discussed importance of isolating from others, wearing a mask at times, to prevent further spread of flu.  Return precautions emphasized that she is not making urine, and not able to tolerate p.o. intake for over 24 hours.  Her abdominal pain was diffuse, and just mild in nature, there is no guarding or rebound, and LFTs are normal, discussed return precautions and follow-up with primary care doctor.  Discussed likely secondary to retching.  Risk Prescription drug management.    Final Clinical Impression(s) / ED Diagnoses Final diagnoses:  Influenza A    Rx / DC Orders ED Discharge Orders          Ordered    ondansetron (ZOFRAN) 4 MG tablet  Every 6 hours        02/10/22 1116              Redina Zeller, [place], [place] 02/10/22 1120    Margette Fast, MD 02/11/22 1652

## 2022-02-10 NOTE — ED Triage Notes (Signed)
Since 12/31 c/o cough, vomiting, unable to tolerate PO x 2 days, abdominal pain.

## 2022-02-10 NOTE — Discharge Instructions (Addendum)
Please follow-up with your primary care doctor as needed.  Make sure you are drinking lots of fluids, and eating bland foods to help with your nausea vomiting.  You can also take Zofran for this I have sent a prescription for it.  Make sure you are isolating from others to prevent them from getting sick and wearing a mask.  If you are unable to keep anything down for over 24 hours, or stop making urine please return to the ER.

## 2022-02-10 NOTE — ED Notes (Signed)
Topaz signature pad not working. Pt understood discharge and medication prescriptions.

## 2022-02-10 NOTE — ED Notes (Signed)
ED Provider at bedside.
